# Patient Record
Sex: Female | Born: 1960 | Race: Black or African American | Hispanic: No | Smoking: Never smoker
Health system: Southern US, Community
[De-identification: ages and names within clinical notes are randomized; demographics above are authoritative.]

## PROBLEM LIST (undated history)

## (undated) DIAGNOSIS — M1990 Unspecified osteoarthritis, unspecified site: Secondary | ICD-10-CM

## (undated) DIAGNOSIS — M1611 Unilateral primary osteoarthritis, right hip: Secondary | ICD-10-CM

## (undated) HISTORY — PX: CHOLECYSTECTOMY: SHX55

## (undated) HISTORY — PX: RECTOCELE REPAIR: SHX761

## (undated) HISTORY — PX: TUBAL LIGATION: SHX77

## (undated) HISTORY — PX: ABDOMINAL HYSTERECTOMY: SHX81

---

## 1999-03-01 ENCOUNTER — Encounter: Payer: Self-pay | Admitting: Emergency Medicine

## 1999-03-01 ENCOUNTER — Emergency Department (HOSPITAL_COMMUNITY): Admission: EM | Admit: 1999-03-01 | Discharge: 1999-03-01 | Payer: Self-pay | Admitting: *Deleted

## 2002-10-07 ENCOUNTER — Inpatient Hospital Stay (HOSPITAL_COMMUNITY): Admission: RE | Admit: 2002-10-07 | Discharge: 2002-10-09 | Payer: Self-pay | Admitting: Obstetrics and Gynecology

## 2004-04-27 ENCOUNTER — Ambulatory Visit (HOSPITAL_COMMUNITY): Admission: RE | Admit: 2004-04-27 | Discharge: 2004-04-27 | Payer: Self-pay | Admitting: Obstetrics and Gynecology

## 2005-05-20 ENCOUNTER — Ambulatory Visit (HOSPITAL_COMMUNITY): Admission: RE | Admit: 2005-05-20 | Discharge: 2005-05-20 | Payer: Self-pay | Admitting: Obstetrics and Gynecology

## 2005-08-01 ENCOUNTER — Observation Stay (HOSPITAL_COMMUNITY): Admission: RE | Admit: 2005-08-01 | Discharge: 2005-08-02 | Payer: Self-pay | Admitting: Obstetrics and Gynecology

## 2005-08-01 ENCOUNTER — Encounter: Payer: Self-pay | Admitting: Obstetrics and Gynecology

## 2008-02-25 ENCOUNTER — Ambulatory Visit (HOSPITAL_COMMUNITY): Admission: RE | Admit: 2008-02-25 | Discharge: 2008-02-25 | Payer: Self-pay | Admitting: Obstetrics and Gynecology

## 2009-05-12 ENCOUNTER — Ambulatory Visit (HOSPITAL_COMMUNITY): Admission: RE | Admit: 2009-05-12 | Discharge: 2009-05-12 | Payer: Self-pay | Admitting: Obstetrics and Gynecology

## 2009-07-25 ENCOUNTER — Ambulatory Visit (HOSPITAL_COMMUNITY): Admission: RE | Admit: 2009-07-25 | Discharge: 2009-07-25 | Payer: Self-pay | Admitting: Internal Medicine

## 2009-08-08 ENCOUNTER — Ambulatory Visit (HOSPITAL_COMMUNITY): Admission: RE | Admit: 2009-08-08 | Discharge: 2009-08-08 | Payer: Self-pay | Admitting: Internal Medicine

## 2009-08-30 ENCOUNTER — Encounter (INDEPENDENT_AMBULATORY_CARE_PROVIDER_SITE_OTHER): Payer: Self-pay | Admitting: General Surgery

## 2009-08-30 ENCOUNTER — Ambulatory Visit (HOSPITAL_COMMUNITY): Admission: RE | Admit: 2009-08-30 | Discharge: 2009-08-31 | Payer: Self-pay | Admitting: General Surgery

## 2010-08-21 ENCOUNTER — Other Ambulatory Visit: Payer: Self-pay | Admitting: Obstetrics and Gynecology

## 2010-08-21 DIAGNOSIS — Z139 Encounter for screening, unspecified: Secondary | ICD-10-CM

## 2010-08-27 ENCOUNTER — Ambulatory Visit (HOSPITAL_COMMUNITY)
Admission: RE | Admit: 2010-08-27 | Discharge: 2010-08-27 | Disposition: A | Discharge status: Home / Self Care | Payer: BC Managed Care – PPO | Source: Ambulatory Visit | Attending: Obstetrics and Gynecology | Admitting: Obstetrics and Gynecology

## 2010-08-27 DIAGNOSIS — Z139 Encounter for screening, unspecified: Secondary | ICD-10-CM

## 2010-08-27 DIAGNOSIS — Z1231 Encounter for screening mammogram for malignant neoplasm of breast: Secondary | ICD-10-CM | POA: Insufficient documentation

## 2010-09-16 LAB — CBC
HCT: 33.9 % — ABNORMAL LOW (ref 36.0–46.0)
Hemoglobin: 13.8 g/dL (ref 12.0–15.0)
MCHC: 35.2 g/dL (ref 30.0–36.0)
MCV: 100.7 fL — ABNORMAL HIGH (ref 78.0–100.0)
RBC: 3.89 MIL/uL (ref 3.87–5.11)
RDW: 13 % (ref 11.5–15.5)

## 2010-09-16 LAB — DIFFERENTIAL
Basophils Absolute: 0 10*3/uL (ref 0.0–0.1)
Lymphocytes Relative: 20 % (ref 12–46)
Lymphs Abs: 1.3 10*3/uL (ref 0.7–4.0)
Monocytes Absolute: 0.3 10*3/uL (ref 0.1–1.0)
Monocytes Relative: 5 % (ref 3–12)
Monocytes Relative: 6 % (ref 3–12)
Neutro Abs: 4.6 10*3/uL (ref 1.7–7.7)
Neutro Abs: 6.3 10*3/uL (ref 1.7–7.7)
Neutrophils Relative %: 77 % (ref 43–77)

## 2010-09-16 LAB — BASIC METABOLIC PANEL
BUN: 7 mg/dL (ref 6–23)
CO2: 27 mEq/L (ref 19–32)
Calcium: 9.2 mg/dL (ref 8.4–10.5)
GFR calc Af Amer: >60 mL/min (ref >60)
GFR calc non Af Amer: >60 mL/min (ref >60)
GFR calc non Af Amer: >60 mL/min (ref >60)
Glucose, Bld: 91 mg/dL (ref 70–99)
Potassium: 4 mEq/L (ref 3.5–5.1)
Sodium: 136 mEq/L (ref 135–145)

## 2010-09-16 LAB — HEPATIC FUNCTION PANEL
ALT: 15 U/L (ref 0–35)
ALT: 22 U/L (ref 0–35)
AST: 16 U/L (ref 0–37)
AST: 18 U/L (ref 0–37)
Albumin: 3 g/dL — ABNORMAL LOW (ref 3.5–5.2)
Albumin: 3.8 g/dL (ref 3.5–5.2)
Alkaline Phosphatase: 37 U/L — ABNORMAL LOW (ref 39–117)
Bilirubin, Direct: 0.1 mg/dL (ref 0.0–0.3)
Total Bilirubin: 0.6 mg/dL (ref 0.3–1.2)

## 2010-11-09 NOTE — H&P (Signed)
NAMECHINENYE, KATZENBERGER                           ACCOUNT NO.:  [id number]   MEDICAL RECORD NO.:  [id number]                   PATIENT TYPE:  AMB   LOCATION:  DAY                                  FACILITY:  [facility]   PHYSICIAN:  Tilda Burrow, M.D.              DATE OF BIRTH:  1961/05/20   DATE OF ADMISSION:  10/07/2002  DATE OF DISCHARGE:                                HISTORY & PHYSICAL   ADMITTING DIAGNOSES:  1. Uterine fibroids, 10 to 12 weeks' size.  2. Metrorrhagia.  3. Anemia.   HISTORY OF PRESENT ILLNESS:  This 50 year old female, gravida 4, para 4,  known to our practice since 1997, is admitted at this time for abdominal  hysterectomy.  She has been seen for several years with known fibroid  enlargement of the uterus.  Recently, she had heavy periods two to three  times per month, severe anemia with hemoglobin of 7.9 on fingerstick, with  other documentations of hemoglobin 8.5, which is a decline from October  2000, where as hemoglobin was 12.0; this is all attributable to the uterine  bleeding.  Ultrasound has shown that the patient has had fibroid uterine  enlargement and a normal endometrial stripe.  She has been placed on  NuvaRing to control the heavy menses and has been allowed to rebuild her  hemoglobin.  Ultrasound showed uterus length 10.8 cm with fibroids up to 4.0  cm.  Consideration was made to proceed with a standard abdominal  hysterectomy; there was previous consideration of total laparoscopic  hysterectomy, but due to uterine size, decision has been made to convert to  standard abdominal hysterectomy.   PAST MEDICAL HISTORY:  Past medical history benign, negative for  hypertension, diabetes or renal abnormalities.   SURGICAL HISTORY:  Tubal ligation in 1989; Cesarean section.   MEDICATIONS:  Vioxx p.r.n. musculoskeletal discomfort.   ALLERGIES:  None.   MEDICATIONS:  NuvaRing for menstrual control.   PHYSICAL EXAMINATION:  GENERAL:  Physical exam  reveals a healthy-appearing  African American female, height 5 feet 4 inches, weight 190.  VITAL SIGNS:  Blood pressure 125/70.  HEENT:  Pupils equal, round and reactive.  NECK:  Neck supple.  Trachea midline.  CHEST:  Chest clear to auscultation.  BREASTS:  Exam normal without masses.  LUNGS:  Lungs clear.  ABDOMEN:  Abdomen nontender with uterus palpable above the symphysis pubis.  The patient has a well-healed lower abdominal transverse scar from C-  section.  PELVIC:  External genitalia normal.  Vaginal exam shows normal secretions  after treatment for trichomoniasis in March.  Cervix is class I Pap smear,  uterus 12 weeks' size, adnexa negative for masses.   PLANS:  Total abdominal hysterectomy, October 07, 2002, with preservation of  her ovaries planned unless suspicious pathology encountered at the time of  surgery.  Tilda Burrow, M.D.    JVF/MEDQ  D:  10/07/2002  T:  10/07/2002  Job:  [id number]

## 2010-11-09 NOTE — H&P (Signed)
NAMELAGUANA, DESAUTEL               ACCOUNT NO.:  [id number]   MEDICAL RECORD NO.:  [id number]          PATIENT TYPE:  AMB   LOCATION:  DAY                           FACILITY:  [facility]   PHYSICIAN:  Tilda Burrow, M.D. DATE OF BIRTH:  Feb 12, 1961   DATE OF ADMISSION:  08/01/2005  DATE OF DISCHARGE:  LH                                HISTORY & PHYSICAL   ADMISSION DIAGNOSIS:  Rectocele.   SECONDARY DIAGNOSIS:  Abdominal wall keloid status post hysterectomy.   HISTORY OF PRESENT ILLNESS:  This 50 year old female is admitted at this  time for posterior repair with the additional surgery planned for surgical  excision of a large keloid on the anterior abdominal wall greater than 1.5  cm in diameter, protruding out hard, immobile, and flexible. Uncomfortable  to contact and occasionally bleeding over its surface due to contact from  clothing. Old records had shown that she had a smaller keloid prior to her  hysterectomy performed in 2004 but since that time the keloid rather quickly  became prominent, hard, immobile and flexible. There has been no softening  of the scar over time. Additionally the patient complains of rectocele  symptoms, with a sense of incomplete evacuation particularly with hard  stools and the plan is to proceed with posterior repair and excision of  keloid at the same time. The patient is aware that the tendency toward  keloid formation may recur and that postoperative steroid injections are  planned during the recovery phase to reduce the likelihood of this  occurrence. She is aware that the possibility of her keloid recurrence  cannot be completely avoided. I personally never had any posterior repair  keloid formation but the patient knows that I cannot give absolute  guarantee.   PAST MEDICAL HISTORY:  1.  Benign surgical history.  2.  Abdominal hysterectomy, April, 2004.  3.  Tubal ligation, 1989.  4.  Cesarean section, 1980's.   MEDICATIONS:  None.   PHYSICAL EXAMINATION:  GENERAL:  Exam shows a generally healthy African-  American female. Height 5 feet, 4 inches. Weight 201.  VITAL SIGNS:  Blood pressure 116/78.  HEENT:  Pupils are equal, round and reactive. Extraocular movements are  intact.  NECK:  Supple.  CHEST:  Clear to auscultation.  ABDOMEN:  Nontender with well-healed scar fibrosed as described earlier,  measuring approximately 20 cm in length with large keloid.  GU:  External genitalia shows a tight anal sphincter with a small rectocele  which represents a 2 cm x 2 cm very pronounced defect above a tight anal  sphincter which was resulting in incomplete evacuation and difficulty with  defecation.  EXTREMITIES:  Grossly normal.   GC/Chlamydia negative. Pap smear currently normal.   PLAN:  1.  Excision of keloid.  2.  Posterior repair to be performed August 01, 2005.      Tilda Burrow, M.D.  Electronically Signed     JVF/MEDQ  D:  07/31/2005  T:  07/31/2005  Job:  [id number]   cc:   Madelin Rear. Sherwood Gambler, MD  Fax: [phone]

## 2010-11-09 NOTE — Discharge Summary (Signed)
   Hannah Daugherty, Hannah Daugherty                           ACCOUNT NO.:  [id number]   MEDICAL RECORD NO.:  [id number]                   PATIENT TYPE:  INP   LOCATION:  A420                                 FACILITY:  [facility]   PHYSICIAN:  Tilda Burrow, M.D.              DATE OF BIRTH:  1960/06/25   DATE OF ADMISSION:  10/07/2002  DATE OF DISCHARGE:  10/09/2002                                 DISCHARGE SUMMARY   ADMISSION DIAGNOSIS:  1. Menorrhagia.  2. Uterine fibroids, 10- to 12-week size.  3. Anemia.   DISCHARGE DIAGNOSES:  1. Menorrhagia.  2. Uterine fibroids, 8-week size.  3. Anemia.  4. Extensive abdominal adhesions to anterior abdominal wall.  5. Endometriosis, grade 1.  6. Cervical dysplasia, moderate grade.   DISCHARGE MEDICATIONS:  1. Tylox one to two q.4h. p.r.n. pain, dispensed 20.  2. Motrin 200 mg q.4h. p.r.n. mild pain.  3. Multivitamins and iron p.r.n. anemia.   HOSPITAL COURSE:  Patient was admitted on 10/07/2002, undergoing abdominal  hysterectomy, as described in the operative note, with extensive adhesions  dissecting from the anterior abdominal wall to the uterine fundus due to her  prior surgeries.  Patient had 400-mL blood loss.  Postoperatively, patient  had an excellent course, considering the degree of difficulty of the  surgery.  Hemoglobin was 10, hematocrit 30.  Incision and dressing were dry.  Patient was stable for discharge on postop day two, tolerating a regular  diet, doing well.  It was felt that the patient was considered stable for  followup in five days in our office for staple removal.   ADDENDUM:  Pathology report shows moderate-grade dysplasia of the cervix and  also showed multiple, intramural and subendometrial fibroids and a  hemorrhagic corpus luteum cyst on the right ovary.  The endometriosis was  confirmed by biopsy as well.                                               Tilda Burrow, M.D.    JVF/MEDQ  D:  11/01/2002  T:   11/02/2002  Job:  [id number]

## 2010-11-09 NOTE — Op Note (Signed)
Hannah Daugherty, Hannah Daugherty                           ACCOUNT NO.:  [id number]   MEDICAL RECORD NO.:  [id number]                   PATIENT TYPE:  INP   LOCATION:  A420                                 FACILITY:  [facility]   PHYSICIAN:  Tilda Burrow, M.D.              DATE OF BIRTH:  07/01/1960   DATE OF PROCEDURE:  DATE OF DISCHARGE:  10/09/2002                                 OPERATIVE REPORT   PREOPERATIVE DIAGNOSES:  1. Uterine fibroids.  2. Menorrhagia.  3. Anemia.   POSTOPERATIVE DIAGNOSES:  1. Menorrhagia.  2. Fibroids.  3. Anemia.  4. Uterine adhesions.  5. Anterior abdominal wall endometriosis.   PROCEDURE:  Total abdominal hysterectomy, right salpingo-oophorectomy, lysis  of adhesions.   SURGEON:  Tilda Burrow, M.D.   ASSISTANT:  __________.   ANESTHESIA:  General.   COMPLICATIONS:  None.   ESTIMATED BLOOD LOSS:  400 cc.   FINDINGS:  Extensive dense adhesions from anterior abdominal wall to top of  uterus.  Bleeding from right ovary requiring ovarian excision.  Tiny bits of  endometriosis in the site of the prior tubal sterilization.   DESCRIPTION OF PROCEDURE:  The patient was taken to the operating room and  prepped and draped for lower abdominal surgery.  The previous old abdominal  scar was excised of cicatrix.  We ran into technical challenges at the time  we entered the uterine cavity because we were identifying its fibrous  tissue, and it could be palpated that the uterus was stuck to the anterior  abdominal wall.  We were able to find a window in the peritoneum and enter  just lateral to the uterus.  This required extensive, careful, cautious  dissection to dissect the uterine fundus away from the underlying tissue.  We proceeded with dissection of the uterine fundus sufficiently that normal  anatomy could be restored for the most part.  There was some dissection into  the uterine fundus during this part of the procedure, and bleeding was  greater than  usual.  The round ligaments could be taken down bilaterally,  and then the utero-ovarian ligament and fallopian tube pedicle were  crossclamped on either side, transected, and suture ligated.  We then  developed a bladder flap anteriorly with some difficulty due to the  adhesions but were able to then march down the uterus on either side.  The  surgery became easier once the uterus was mobilized.  The uterine vessels  were clamped, cut, and suture ligated on either side using straight Heaney  clamps, 0 chromic, and Mayo scissors dissection.  The upper and lower  cardinal ligaments were taken down with straight Heaney clamps, knife  dissection, and 0 chromic suture ligature.  Upon reaching the vaginal cuff,  we proceeded to stab into the anterior cervical edge of the fornix, and  grasped the cuff with Kocher clamps and excising the cervix.  The cuff  was  closed using figure-of-eight sutures of 0 chromic with Aldridge stitches  placed at each lateral edge __________ to achieve hemostasis and cuff  support.  The patient then had the procedure completed by irrigation of the  pelvis, closure of the peritoneal floor, and spacing the pedicles.  The  right ovary had become somewhat macerated during the procedure, and its  pedicle continued to ooze.  After efforts to stitch, the ovary did not  achieve adequate hemostasis, so salpingo-oophorectomy was necessary.  The  infundibulopelvic ligament was isolated, clamped, cut, and suture ligated.  The ovary was transected.  The pelvis was irrigated again, confirming  hemostasis.  The anterior peritoneum was closed using 2-0 chromic with some  acknowledged area of irregularity in the suspected future adhesions to the  anterior abdominal wall.  The omentum was pulled over the area of  irregularity.   The rectus muscles were pulled in the midline and the fascia closed using 0  Vicryl, and staple closure of the skin completed the procedure.  The patient   tolerated the procedure well.                                               Tilda Burrow, M.D.    JVF/MEDQ  D:  11/01/2002  T:  11/02/2002  Job:  [id number]

## 2010-11-09 NOTE — Op Note (Signed)
Hannah Daugherty, Hannah Daugherty               ACCOUNT NO.:  [id number]   MEDICAL RECORD NO.:  [id number]          PATIENT TYPE:  INP   LOCATION:  A419                          FACILITY:  [facility]   PHYSICIAN:  Tilda Burrow, M.D. DATE OF BIRTH:  03-Aug-1960   DATE OF PROCEDURE:  08/01/2005  DATE OF DISCHARGE:                                 OPERATIVE REPORT   PREOPERATIVE DIAGNOSIS:  Rectocele, abdominal keloid.   POSTOPERATIVE DIAGNOSIS:  Rectocele, abdominal keloid.   PROCEDURE:  1.  Posterior repair.  2.  Excision of abdominal wall keloid, 15 cm length.   SURGEON:  Tilda Burrow, M.D.   ASSISTANTAnnabell Howells, R.N.   ANESTHESIA:  General.   COMPLICATIONS:  None.   FINDINGS:  Generalized laxity of perineal body with extensive forward  rotation of the rectum. Abdominal wall scar well described in HPI.   DETAILS OF PROCEDURE:  The patient was taken to the operating room, prepped  and draped for lower abdominal surgery first. The Pfannenstiel-type incision  scar was present. Three-fourths of it on the left side and extending half  way across the right side was excised using #15 blade on each edge of the  old cicatrix, which was then grasped, elevated, and Bovie cautery used to  core out the cicatrix and the underlying fibrosis. The area was quite  vascular, and point cautery was used as necessary. The skin tone was quite  good, so we had to undermine the tissues of it to give good skin edge  approximation. Any underlying subcutaneous fibrosis was trimmed out to be  sure that the abdomen was not thicker just under the scar. We then pulled  the mobilized subcutaneous fatty tissues together with a series of  interrupted vertical mattress sutures with 2-0 plain. The subcutaneous  tissues were reapproximated using subcuticular 4-0 Dexon during the middle  one half of the incision. The entire incision was then closed using staple  closure of the skin. Hemostasis was quite satisfactory at this  time.   The wound was draped with OpSite, and so then we proceeded with posterior  repair.   The patient was repositioned with legs elevated in candy-cane support  stirrups and prepped and draped. The very lax, redundant vaginal mucosa was  grasped with an Allis clamp two third of the way up the posterior vaginal  wall, as well as grasped in the midline at the introitus. The skin was split  from over the posterior vagina from the introitus at the level of the hymen  remnant, for a distance half way up the vagina. The underlying tissues were  undermined on either side. Tissues were incredibly relaxed and hypermobile.  A double-gloved digital rectal exam was performed with the operator's right  index finger to allow identification of tissues. That way, we stayed out of  the rectum. We then proceeded to dissect laterally above the tissues of the  rectum and on either side to identify more well connected supportive tissue.  Allis clamps were used to grasp the tissues. A series of half dozen  interrupted vertical mattress sutures were placed  beginning well off the  posterior vagina, building a more reinforced posterior vaginal wall.  Interrupted sutures were pulled together and tagged, and after the index  finger was removed, then these were tied to the midline after appropriate  changing of gloves, etc.   The digital exam was repeated with a double-gloved hand and double-gloved  finger, and two additional sutures were found to be necessary at the lower  most portions just above the anal sphincter. This resulted in a good  perineal body support two thirds way up the vagina. This allowed easy  introduction of two fingers transversely but dramatically reduced anterior  mobility of the rectum. The patient tolerated the procedure well. Redundant  vaginal mucosa was trimmed, the edges pulled in the midline for  reapproximation. Sponge and needle counts were correct. The patient went to  the  recovery room in good condition, having received surgery with 150 cc  blood loss. Note, antibiotic prophylaxis administered preprocedure.      Tilda Burrow, M.D.  Electronically Signed     JVF/MEDQ  D:  08/01/2005  T:  08/02/2005  Job:  [id number]   cc:   [facility]   [facility]   Madelin Rear. Sherwood Gambler, MD  Fax: [phone]

## 2014-01-24 ENCOUNTER — Ambulatory Visit (INDEPENDENT_AMBULATORY_CARE_PROVIDER_SITE_OTHER): Payer: 59 | Admitting: Obstetrics & Gynecology

## 2014-01-24 ENCOUNTER — Encounter: Payer: Self-pay | Admitting: Obstetrics & Gynecology

## 2014-01-24 VITALS — BP 152/100 | Ht 64.0 in | Wt 198.0 lb

## 2014-01-24 DIAGNOSIS — N760 Acute vaginitis: Secondary | ICD-10-CM | POA: Insufficient documentation

## 2014-01-24 MED ORDER — METRONIDAZOLE 0.75 % VA GEL: VAGINAL | Status: DC

## 2014-01-24 NOTE — Progress Notes (Signed)
Patient ID: Ezra Siteseressa F Pitcher, female   DOB: 04/27/1961, 53 y.o.   MRN: [id number] Chief Complaint  Patient presents with  . Vaginal Bleeding    Had hysterectomy    HPI 2 episodes of pink/reddish spotting Was there when she wiped No sure of source  ROS No burning with urination, frequency or urgency No nausea, vomiting or diarrhea Nor fever chills or other constitutional symptoms   Blood pressure 152/100, height 5\' 4"  (1.626 m), weight 198 lb (89.812 kg).  EXAM Abdomen:       Vulva:            normal appearing vulva with no masses, tenderness or lesions Vagina:          normal mucosa, thin grey discharge Cervix:           absent Uterus:          absent Adnexa:         normal adnexa in size, nontender and no masses Rectal:           negative without mass, lesions or tenderness, stool guaiac negative Hemoccult:     Performed  Wet prep:  +BV No yeast no trich                          Assessment/Plan:  BV: metrogel qhs x 5 Probably undergoing some vaginal changes

## 2015-09-23 HISTORY — PX: ANKLE SURGERY: SHX546

## 2016-09-30 DIAGNOSIS — E559 Vitamin D deficiency, unspecified: Secondary | ICD-10-CM | POA: Diagnosis not present

## 2016-09-30 DIAGNOSIS — R7301 Impaired fasting glucose: Secondary | ICD-10-CM | POA: Diagnosis not present

## 2016-09-30 DIAGNOSIS — I10 Essential (primary) hypertension: Secondary | ICD-10-CM | POA: Diagnosis not present

## 2016-10-02 DIAGNOSIS — R7301 Impaired fasting glucose: Secondary | ICD-10-CM | POA: Diagnosis not present

## 2016-10-02 DIAGNOSIS — Z0000 Encounter for general adult medical examination without abnormal findings: Secondary | ICD-10-CM | POA: Diagnosis not present

## 2016-10-02 DIAGNOSIS — I10 Essential (primary) hypertension: Secondary | ICD-10-CM | POA: Diagnosis not present

## 2016-10-04 ENCOUNTER — Other Ambulatory Visit (HOSPITAL_COMMUNITY): Payer: Self-pay | Admitting: Internal Medicine

## 2016-10-04 DIAGNOSIS — Z1231 Encounter for screening mammogram for malignant neoplasm of breast: Secondary | ICD-10-CM

## 2016-10-08 DIAGNOSIS — Z1231 Encounter for screening mammogram for malignant neoplasm of breast: Secondary | ICD-10-CM | POA: Diagnosis not present

## 2016-10-16 DIAGNOSIS — M1611 Unilateral primary osteoarthritis, right hip: Secondary | ICD-10-CM | POA: Diagnosis not present

## 2016-10-18 ENCOUNTER — Ambulatory Visit (HOSPITAL_COMMUNITY): Payer: Self-pay

## 2016-10-25 ENCOUNTER — Other Ambulatory Visit: Payer: Self-pay | Admitting: Orthopedic Surgery

## 2016-11-13 ENCOUNTER — Encounter (HOSPITAL_COMMUNITY): Payer: Self-pay

## 2016-11-13 NOTE — Pre-Procedure Instructions (Addendum)
Hannah Daugherty  11/13/2016      [facility] [address] - [place], [place] - [address] [place] [address] Phone: [phone] Fax: [phone]    Your procedure is scheduled on Tues. June 5  [facility] at 11:15 A.M.  Call this number if you have problems the morning of surgery:  [phone]   Remember:  Do not eat food or drink liquids after midnight on Mon. June 4   Take these medicines the morning of surgery with A SIP OF WATER : None             1 week prior to surgery stop: advil, motrin, ibuprofen, aleve, BC Powders, Goody's, herbal life pill    Do not wear jewelry, make-up or nail polish.  Do not wear lotions, powders, or perfumes, or deoderant.  Do not shave 48 hours prior to surgery.  Men may shave face and neck.  Do not bring valuables to the hospital.  [facility] is not responsible for any belongings or valuables.  Contacts, dentures or bridgework may not be worn into surgery.  Leave your suitcase in the car.  After surgery it may be brought to your room.  For patients admitted to the hospital, discharge time will be determined by your treatment team.  Patients discharged the day of surgery will not be allowed to drive home.    Special instructions:  [facility]- Preparing For Surgery  Before surgery, you can play an important role. Because skin is not sterile, your skin needs to be as free of germs as possible. You can reduce the number of germs on your skin by washing with CHG (chlorahexidine gluconate) Soap before surgery.  CHG is an antiseptic cleaner which kills germs and bonds with the skin to continue killing germs even after washing.  Please do not use if you have an allergy to CHG or antibacterial soaps. If your skin becomes reddened/irritated stop using the CHG.  Do not shave (including legs and underarms) for at least 48 hours prior to first CHG shower. It is OK to shave your face.  Please follow these  instructions carefully.   1. Shower the NIGHT BEFORE SURGERY and the MORNING OF SURGERY with CHG.   2. If you chose to wash your hair, wash your hair first as usual with your normal shampoo.  3. After you shampoo, rinse your hair and body thoroughly to remove the shampoo.  4. Use CHG as you would any other liquid soap. You can apply CHG directly to the skin and wash gently with a scrungie or a clean washcloth.   5. Apply the CHG Soap to your body ONLY FROM THE NECK DOWN.  Do not use on open wounds or open sores. Avoid contact with your eyes, ears, mouth and genitals (private parts). Wash genitals (private parts) with your normal soap.  6. Wash thoroughly, paying special attention to the area where your surgery will be performed.  7. Thoroughly rinse your body with warm water from the neck down.  8. DO NOT shower/wash with your normal soap after using and rinsing off the CHG Soap.  9. Pat yourself dry with a CLEAN TOWEL.   10. Wear CLEAN PAJAMAS   11. Place CLEAN SHEETS on your bed the night of your first shower and DO NOT SLEEP WITH PETS.    Day of Surgery: Do not apply any deodorants/lotions. Please wear clean clothes to the hospital/surgery  center.      Please read over the following fact sheets that you were given. Coughing and Deep Breathing, MRSA Information and Surgical Site Infection Prevention

## 2016-11-14 ENCOUNTER — Encounter (HOSPITAL_COMMUNITY): Payer: Self-pay

## 2016-11-14 ENCOUNTER — Encounter (HOSPITAL_COMMUNITY)
Admission: RE | Admit: 2016-11-14 | Discharge: 2016-11-14 | Disposition: A | Discharge status: Home / Self Care | Payer: 59 | Source: Ambulatory Visit | Attending: Orthopedic Surgery | Admitting: Orthopedic Surgery

## 2016-11-14 DIAGNOSIS — M1611 Unilateral primary osteoarthritis, right hip: Secondary | ICD-10-CM | POA: Diagnosis not present

## 2016-11-14 DIAGNOSIS — Z01812 Encounter for preprocedural laboratory examination: Secondary | ICD-10-CM | POA: Insufficient documentation

## 2016-11-14 DIAGNOSIS — Z01810 Encounter for preprocedural cardiovascular examination: Secondary | ICD-10-CM | POA: Insufficient documentation

## 2016-11-14 HISTORY — DX: Unspecified osteoarthritis, unspecified site: M19.90

## 2016-11-14 LAB — BASIC METABOLIC PANEL
Anion gap: 7 (ref 5–15)
BUN: 19 mg/dL (ref 6–20)
CALCIUM: 9.2 mg/dL (ref 8.9–10.3)
CO2: 28 mmol/L (ref 22–32)
Chloride: 106 mmol/L (ref 101–111)
Creatinine, Ser: 0.87 mg/dL (ref 0.44–1.00)
GFR calc Af Amer: >60 mL/min (ref >60)
GLUCOSE: 93 mg/dL (ref 65–99)
POTASSIUM: 4.1 mmol/L (ref 3.5–5.1)
Sodium: 141 mmol/L (ref 135–145)

## 2016-11-14 LAB — CBC
HEMATOCRIT: 40.1 % (ref 36.0–46.0)
Hemoglobin: 13.4 g/dL (ref 12.0–15.0)
MCH: 32.4 pg (ref 26.0–34.0)
MCHC: 33.4 g/dL (ref 30.0–36.0)
MCV: 97.1 fL (ref 78.0–100.0)
PLATELETS: 211 10*3/uL (ref 150–400)
RBC: 4.13 MIL/uL (ref 3.87–5.11)
RDW: 12.6 % (ref 11.5–15.5)
WBC: 5.6 10*3/uL (ref 4.0–10.5)

## 2016-11-14 LAB — SURGICAL PCR SCREEN
MRSA, PCR: NEGATIVE
STAPHYLOCOCCUS AUREUS: NEGATIVE

## 2016-11-14 NOTE — Progress Notes (Signed)
PCP: Dr. Fidel LevyJohn Zack Hall in [place]

## 2016-11-25 ENCOUNTER — Encounter (HOSPITAL_COMMUNITY): Payer: Self-pay | Admitting: Anesthesiology

## 2016-11-25 MED ORDER — CEFAZOLIN SODIUM-DEXTROSE 2-4 GM/100ML-% IV SOLN
2.0000 g | INTRAVENOUS | Status: AC
  Administered 2016-11-26: 2 g via INTRAVENOUS
  Filled 2016-11-25: qty 100

## 2016-11-25 NOTE — Anesthesia Preprocedure Evaluation (Addendum)
Anesthesia Evaluation  Patient identified by MRN, date of birth, ID band Patient awake    Reviewed: Allergy & Precautions, NPO status , Patient's Chart, lab work & pertinent test results  Airway Mallampati: II  TM Distance: >3 FB Neck ROM: Full    Dental no notable dental hx. (+) Teeth Intact   Pulmonary neg pulmonary ROS,    Pulmonary exam normal breath sounds clear to auscultation       Cardiovascular negative cardio ROS Normal cardiovascular exam Rhythm:Regular Rate:Normal     Neuro/Psych negative neurological ROS  negative psych ROS   GI/Hepatic negative GI ROS, Neg liver ROS,   Endo/Other  Obesity   Renal/GU negative Renal ROS  negative genitourinary   Musculoskeletal  (+) Arthritis , Osteoarthritis,  DJD right hip    Abdominal (+) + obese,   Peds  Hematology negative hematology ROS (+)   Anesthesia Other Findings   Reproductive/Obstetrics                            Anesthesia Physical Anesthesia Plan  ASA: II  Anesthesia Plan: General   Post-op Pain Management:    Induction:   PONV Risk Score and Plan: 3 and Ondansetron, Dexamethasone, Propofol, Midazolam, Treatment may vary due to age and Scopolamine patch - Pre-op  Airway Management Planned: Oral ETT  Additional Equipment:   Intra-op Plan:   Post-operative Plan: Extubation in OR  Informed Consent: I have reviewed the patients History and Physical, chart, labs and discussed the procedure including the risks, benefits and alternatives for the proposed anesthesia with the patient or authorized representative who has indicated his/her understanding and acceptance.   Dental advisory given  Plan Discussed with: Anesthesiologist, CRNA and Surgeon  Anesthesia Plan Comments:        Anesthesia Quick Evaluation

## 2016-11-26 ENCOUNTER — Encounter (HOSPITAL_COMMUNITY): Payer: Self-pay | Admitting: General Practice

## 2016-11-26 ENCOUNTER — Inpatient Hospital Stay (HOSPITAL_COMMUNITY)
Admission: RE | Admit: 2016-11-26 | Discharge: 2016-11-28 | DRG: 470 | Disposition: A | Discharge status: Home / Self Care | Payer: 59 | Source: Ambulatory Visit | Attending: Orthopedic Surgery | Admitting: Orthopedic Surgery

## 2016-11-26 ENCOUNTER — Inpatient Hospital Stay (HOSPITAL_COMMUNITY): Payer: 59

## 2016-11-26 ENCOUNTER — Inpatient Hospital Stay (HOSPITAL_COMMUNITY): Payer: 59 | Admitting: Anesthesiology

## 2016-11-26 ENCOUNTER — Encounter (HOSPITAL_COMMUNITY)
Admission: RE | Disposition: A | Discharge status: Home / Self Care | Payer: Self-pay | Source: Ambulatory Visit | Attending: Orthopedic Surgery

## 2016-11-26 DIAGNOSIS — M1610 Unilateral primary osteoarthritis, unspecified hip: Secondary | ICD-10-CM

## 2016-11-26 DIAGNOSIS — M1611 Unilateral primary osteoarthritis, right hip: Secondary | ICD-10-CM | POA: Diagnosis not present

## 2016-11-26 DIAGNOSIS — Z885 Allergy status to narcotic agent status: Secondary | ICD-10-CM | POA: Diagnosis not present

## 2016-11-26 DIAGNOSIS — Z833 Family history of diabetes mellitus: Secondary | ICD-10-CM | POA: Diagnosis not present

## 2016-11-26 DIAGNOSIS — Z8249 Family history of ischemic heart disease and other diseases of the circulatory system: Secondary | ICD-10-CM

## 2016-11-26 DIAGNOSIS — Z9049 Acquired absence of other specified parts of digestive tract: Secondary | ICD-10-CM | POA: Diagnosis not present

## 2016-11-26 DIAGNOSIS — M25551 Pain in right hip: Secondary | ICD-10-CM | POA: Diagnosis not present

## 2016-11-26 DIAGNOSIS — Z90710 Acquired absence of both cervix and uterus: Secondary | ICD-10-CM | POA: Diagnosis not present

## 2016-11-26 DIAGNOSIS — Z471 Aftercare following joint replacement surgery: Secondary | ICD-10-CM | POA: Diagnosis not present

## 2016-11-26 DIAGNOSIS — Z96641 Presence of right artificial hip joint: Secondary | ICD-10-CM

## 2016-11-26 DIAGNOSIS — Z8049 Family history of malignant neoplasm of other genital organs: Secondary | ICD-10-CM | POA: Diagnosis not present

## 2016-11-26 HISTORY — DX: Unilateral primary osteoarthritis, right hip: M16.11

## 2016-11-26 HISTORY — PX: TOTAL HIP ARTHROPLASTY: SHX124

## 2016-11-26 LAB — GLUCOSE, CAPILLARY: Glucose-Capillary: 118 mg/dL — ABNORMAL HIGH (ref 65–99)

## 2016-11-26 SURGERY — ARTHROPLASTY, HIP, TOTAL,POSTERIOR APPROACH
Anesthesia: General | Site: Hip | Laterality: Right

## 2016-11-26 MED ORDER — PROPOFOL 1000 MG/100ML IV EMUL
INTRAVENOUS | Status: AC
  Filled 2016-11-26: qty 200

## 2016-11-26 MED ORDER — DOCUSATE SODIUM 100 MG PO CAPS
100.0000 mg | ORAL_CAPSULE | Freq: Two times a day (BID) | ORAL | Status: DC
  Administered 2016-11-26 – 2016-11-28 (×4): 100 mg via ORAL
  Filled 2016-11-26 (×4): qty 1

## 2016-11-26 MED ORDER — ROCURONIUM BROMIDE 10 MG/ML (PF) SYRINGE
PREFILLED_SYRINGE | INTRAVENOUS | Status: AC
  Filled 2016-11-26: qty 5

## 2016-11-26 MED ORDER — SENNA 8.6 MG PO TABS
1.0000 | ORAL_TABLET | Freq: Two times a day (BID) | ORAL | Status: DC
  Administered 2016-11-26 – 2016-11-28 (×4): 8.6 mg via ORAL
  Filled 2016-11-26 (×4): qty 1

## 2016-11-26 MED ORDER — LIDOCAINE HCL (CARDIAC) 20 MG/ML IV SOLN
INTRAVENOUS | Status: DC | PRN
  Administered 2016-11-26: 60 mg via INTRAVENOUS

## 2016-11-26 MED ORDER — SENNA-DOCUSATE SODIUM 8.6-50 MG PO TABS: 2.0000 | ORAL_TABLET | Freq: Every day | ORAL | 1 refills | Status: AC

## 2016-11-26 MED ORDER — PROPOFOL 10 MG/ML IV BOLUS
INTRAVENOUS | Status: DC | PRN
  Administered 2016-11-26: 150 mg via INTRAVENOUS

## 2016-11-26 MED ORDER — FENTANYL CITRATE (PF) 100 MCG/2ML IJ SOLN
INTRAMUSCULAR | Status: DC | PRN
  Administered 2016-11-26: 50 ug via INTRAVENOUS
  Administered 2016-11-26 (×2): 100 ug via INTRAVENOUS
  Administered 2016-11-26: 50 ug via INTRAVENOUS
  Administered 2016-11-26: 150 ug via INTRAVENOUS
  Administered 2016-11-26: 50 ug via INTRAVENOUS

## 2016-11-26 MED ORDER — BACLOFEN 10 MG PO TABS: 10.0000 mg | ORAL_TABLET | Freq: Three times a day (TID) | ORAL | 0 refills | Status: AC

## 2016-11-26 MED ORDER — METOCLOPRAMIDE HCL 5 MG PO TABS: 5.0000 mg | ORAL_TABLET | Freq: Three times a day (TID) | ORAL | Status: DC | PRN

## 2016-11-26 MED ORDER — MIDAZOLAM HCL 2 MG/2ML IJ SOLN
INTRAMUSCULAR | Status: AC
  Filled 2016-11-26: qty 2

## 2016-11-26 MED ORDER — 0.9 % SODIUM CHLORIDE (POUR BTL) OPTIME
TOPICAL | Status: DC | PRN
  Administered 2016-11-26: 1000 mL

## 2016-11-26 MED ORDER — DIPHENHYDRAMINE HCL 12.5 MG/5ML PO ELIX: 12.5000 mg | ORAL_SOLUTION | ORAL | Status: DC | PRN

## 2016-11-26 MED ORDER — FENTANYL CITRATE (PF) 250 MCG/5ML IJ SOLN
INTRAMUSCULAR | Status: AC
  Filled 2016-11-26: qty 5

## 2016-11-26 MED ORDER — MAGNESIUM CITRATE PO SOLN: 1.0000 | Freq: Once | ORAL | Status: DC | PRN

## 2016-11-26 MED ORDER — CEFAZOLIN SODIUM-DEXTROSE 2-4 GM/100ML-% IV SOLN
2.0000 g | Freq: Four times a day (QID) | INTRAVENOUS | Status: AC
  Administered 2016-11-26 (×2): 2 g via INTRAVENOUS
  Filled 2016-11-26 (×2): qty 100

## 2016-11-26 MED ORDER — RIVAROXABAN 10 MG PO TABS: 10.0000 mg | ORAL_TABLET | Freq: Every day | ORAL | 0 refills | Status: AC

## 2016-11-26 MED ORDER — BUPIVACAINE HCL (PF) 0.25 % IJ SOLN
INTRAMUSCULAR | Status: DC | PRN
  Administered 2016-11-26: 30 mL

## 2016-11-26 MED ORDER — HYDROMORPHONE HCL 1 MG/ML IJ SOLN
0.2500 mg | INTRAMUSCULAR | Status: DC | PRN
  Administered 2016-11-26 (×2): 0.5 mg via INTRAVENOUS

## 2016-11-26 MED ORDER — ACETAMINOPHEN 325 MG PO TABS
650.0000 mg | ORAL_TABLET | Freq: Four times a day (QID) | ORAL | Status: DC | PRN
  Administered 2016-11-27: 650 mg via ORAL
  Filled 2016-11-26: qty 2

## 2016-11-26 MED ORDER — METHOCARBAMOL 500 MG PO TABS
ORAL_TABLET | ORAL | Status: AC
  Filled 2016-11-26: qty 1

## 2016-11-26 MED ORDER — HYDROMORPHONE HCL 1 MG/ML IJ SOLN: 0.5000 mg | INTRAMUSCULAR | Status: DC | PRN

## 2016-11-26 MED ORDER — LACTATED RINGERS IV SOLN
INTRAVENOUS | Status: DC | PRN
  Administered 2016-11-26 (×2): via INTRAVENOUS

## 2016-11-26 MED ORDER — METHOCARBAMOL 500 MG PO TABS
500.0000 mg | ORAL_TABLET | Freq: Four times a day (QID) | ORAL | Status: DC | PRN
  Administered 2016-11-26 (×2): 500 mg via ORAL
  Filled 2016-11-26: qty 1

## 2016-11-26 MED ORDER — HYDROMORPHONE HCL 1 MG/ML IJ SOLN
INTRAMUSCULAR | Status: AC
  Filled 2016-11-26: qty 1

## 2016-11-26 MED ORDER — ZOLPIDEM TARTRATE 5 MG PO TABS: 5.0000 mg | ORAL_TABLET | Freq: Every evening | ORAL | Status: DC | PRN

## 2016-11-26 MED ORDER — DEXAMETHASONE SODIUM PHOSPHATE 10 MG/ML IJ SOLN
10.0000 mg | Freq: Once | INTRAMUSCULAR | Status: AC
  Administered 2016-11-27: 10 mg via INTRAVENOUS
  Filled 2016-11-26: qty 1

## 2016-11-26 MED ORDER — ONDANSETRON HCL 4 MG/2ML IJ SOLN
INTRAMUSCULAR | Status: DC | PRN
  Administered 2016-11-26: 4 mg via INTRAVENOUS

## 2016-11-26 MED ORDER — ROCURONIUM BROMIDE 100 MG/10ML IV SOLN
INTRAVENOUS | Status: DC | PRN
  Administered 2016-11-26: 50 mg via INTRAVENOUS
  Administered 2016-11-26: 10 mg via INTRAVENOUS

## 2016-11-26 MED ORDER — PROMETHAZINE HCL 25 MG/ML IJ SOLN: 6.2500 mg | INTRAMUSCULAR | Status: DC | PRN

## 2016-11-26 MED ORDER — KETOROLAC TROMETHAMINE 15 MG/ML IJ SOLN
INTRAMUSCULAR | Status: AC
  Filled 2016-11-26: qty 1

## 2016-11-26 MED ORDER — OXYCODONE HCL 5 MG PO TABS
5.0000 mg | ORAL_TABLET | ORAL | Status: DC | PRN
  Administered 2016-11-26 – 2016-11-27 (×3): 10 mg via ORAL
  Administered 2016-11-28: 5 mg via ORAL
  Filled 2016-11-26 (×4): qty 2
  Filled 2016-11-26: qty 1

## 2016-11-26 MED ORDER — SUCCINYLCHOLINE CHLORIDE 20 MG/ML IJ SOLN
INTRAMUSCULAR | Status: DC | PRN
  Administered 2016-11-26: 120 mg via INTRAVENOUS

## 2016-11-26 MED ORDER — SCOPOLAMINE 1 MG/3DAYS TD PT72
MEDICATED_PATCH | TRANSDERMAL | Status: DC | PRN
  Administered 2016-11-26: 1 via TRANSDERMAL

## 2016-11-26 MED ORDER — POTASSIUM CHLORIDE IN NACL 20-0.45 MEQ/L-% IV SOLN
INTRAVENOUS | Status: DC
  Administered 2016-11-26: 12:00:00 via INTRAVENOUS
  Filled 2016-11-26 (×2): qty 1000

## 2016-11-26 MED ORDER — FENTANYL CITRATE (PF) 250 MCG/5ML IJ SOLN
INTRAMUSCULAR | Status: AC
Start: 2016-11-26 — End: 2016-11-26
  Filled 2016-11-26: qty 5

## 2016-11-26 MED ORDER — BISACODYL 10 MG RE SUPP: 10.0000 mg | Freq: Every day | RECTAL | Status: DC | PRN

## 2016-11-26 MED ORDER — MEPERIDINE HCL 25 MG/ML IJ SOLN: 6.2500 mg | INTRAMUSCULAR | Status: DC | PRN

## 2016-11-26 MED ORDER — ONDANSETRON HCL 4 MG PO TABS: 4.0000 mg | ORAL_TABLET | Freq: Four times a day (QID) | ORAL | Status: DC | PRN

## 2016-11-26 MED ORDER — MIDAZOLAM HCL 5 MG/5ML IJ SOLN
INTRAMUSCULAR | Status: DC | PRN
  Administered 2016-11-26: 2 mg via INTRAVENOUS

## 2016-11-26 MED ORDER — ALUM & MAG HYDROXIDE-SIMETH 200-200-20 MG/5ML PO SUSP: 30.0000 mL | ORAL | Status: DC | PRN

## 2016-11-26 MED ORDER — PROPOFOL 10 MG/ML IV BOLUS
INTRAVENOUS | Status: AC
  Filled 2016-11-26: qty 20

## 2016-11-26 MED ORDER — ACETAMINOPHEN 650 MG RE SUPP: 650.0000 mg | Freq: Four times a day (QID) | RECTAL | Status: DC | PRN

## 2016-11-26 MED ORDER — BUPIVACAINE HCL (PF) 0.25 % IJ SOLN
INTRAMUSCULAR | Status: AC
  Filled 2016-11-26: qty 30

## 2016-11-26 MED ORDER — SCOPOLAMINE 1 MG/3DAYS TD PT72
MEDICATED_PATCH | TRANSDERMAL | Status: AC
  Filled 2016-11-26: qty 1

## 2016-11-26 MED ORDER — ONDANSETRON HCL 4 MG/2ML IJ SOLN
4.0000 mg | Freq: Four times a day (QID) | INTRAMUSCULAR | Status: DC | PRN
  Administered 2016-11-26: 4 mg via INTRAVENOUS
  Filled 2016-11-26: qty 2

## 2016-11-26 MED ORDER — METOCLOPRAMIDE HCL 5 MG/ML IJ SOLN
5.0000 mg | Freq: Three times a day (TID) | INTRAMUSCULAR | Status: DC | PRN
  Administered 2016-11-26: 10 mg via INTRAVENOUS
  Filled 2016-11-26: qty 2

## 2016-11-26 MED ORDER — EPHEDRINE SULFATE 50 MG/ML IJ SOLN
INTRAMUSCULAR | Status: DC | PRN
  Administered 2016-11-26 (×3): 10 mg via INTRAVENOUS

## 2016-11-26 MED ORDER — ONDANSETRON HCL 4 MG PO TABS: 4.0000 mg | ORAL_TABLET | Freq: Three times a day (TID) | ORAL | 0 refills | Status: AC | PRN

## 2016-11-26 MED ORDER — PHENOL 1.4 % MT LIQD: 1.0000 | OROMUCOSAL | Status: DC | PRN

## 2016-11-26 MED ORDER — SUGAMMADEX SODIUM 200 MG/2ML IV SOLN
INTRAVENOUS | Status: DC | PRN
  Administered 2016-11-26: 200 mg via INTRAVENOUS

## 2016-11-26 MED ORDER — KETOROLAC TROMETHAMINE 15 MG/ML IJ SOLN
15.0000 mg | Freq: Four times a day (QID) | INTRAMUSCULAR | Status: AC
  Administered 2016-11-26 – 2016-11-27 (×4): 15 mg via INTRAVENOUS
  Filled 2016-11-26 (×3): qty 1

## 2016-11-26 MED ORDER — DEXTROSE 5 % IV SOLN
500.0000 mg | Freq: Four times a day (QID) | INTRAVENOUS | Status: DC | PRN
  Filled 2016-11-26: qty 5

## 2016-11-26 MED ORDER — GABAPENTIN 300 MG PO CAPS
300.0000 mg | ORAL_CAPSULE | Freq: Three times a day (TID) | ORAL | Status: DC
  Administered 2016-11-26 – 2016-11-28 (×6): 300 mg via ORAL
  Filled 2016-11-26 (×6): qty 1

## 2016-11-26 MED ORDER — RIVAROXABAN 10 MG PO TABS
10.0000 mg | ORAL_TABLET | Freq: Every day | ORAL | Status: DC
  Administered 2016-11-27 – 2016-11-28 (×2): 10 mg via ORAL
  Filled 2016-11-26 (×2): qty 1

## 2016-11-26 MED ORDER — SODIUM CHLORIDE 0.9 % IR SOLN: Status: DC | PRN

## 2016-11-26 MED ORDER — OXYCODONE HCL 5 MG PO TABS
5.0000 mg | ORAL_TABLET | ORAL | 0 refills | Status: AC | PRN
Start: 2016-11-26 — End: ?

## 2016-11-26 MED ORDER — MENTHOL 3 MG MT LOZG: 1.0000 | LOZENGE | OROMUCOSAL | Status: DC | PRN

## 2016-11-26 MED ORDER — POLYETHYLENE GLYCOL 3350 17 G PO PACK: 17.0000 g | PACK | Freq: Every day | ORAL | Status: DC | PRN

## 2016-11-26 MED ORDER — EPHEDRINE 5 MG/ML INJ
INTRAVENOUS | Status: AC
  Filled 2016-11-26: qty 10

## 2016-11-26 SURGICAL SUPPLY — 50 items
BIT DRILL 5/64X5 DISP (BIT) ×2 IMPLANT
BLADE SAW SAG 73X25 THK (BLADE) ×1
BLADE SAW SGTL 73X25 THK (BLADE) ×1 IMPLANT
BLADE SURG 10 STRL SS (BLADE) ×2 IMPLANT
CAPT HIP TOTAL 2 ×2 IMPLANT
CLSR STERI-STRIP ANTIMIC 1/2X4 (GAUZE/BANDAGES/DRESSINGS) ×4 IMPLANT
COVER SURGICAL LIGHT HANDLE (MISCELLANEOUS) ×2 IMPLANT
DRAPE INCISE IOBAN 66X45 STRL (DRAPES) ×2 IMPLANT
DRAPE ORTHO SPLIT 77X108 STRL (DRAPES) ×2
DRAPE SURG ORHT 6 SPLT 77X108 (DRAPES) ×2 IMPLANT
DRAPE U-SHAPE 47X51 STRL (DRAPES) ×2 IMPLANT
DRSG MEPILEX BORDER 4X12 (GAUZE/BANDAGES/DRESSINGS) ×2 IMPLANT
DRSG MEPILEX BORDER 4X8 (GAUZE/BANDAGES/DRESSINGS) IMPLANT
DURAPREP 26ML APPLICATOR (WOUND CARE) ×6 IMPLANT
ELECT CAUTERY BLADE 6.4 (BLADE) ×2 IMPLANT
ELECT REM PT RETURN 9FT ADLT (ELECTROSURGICAL) ×2
ELECTRODE REM PT RTRN 9FT ADLT (ELECTROSURGICAL) ×1 IMPLANT
GLOVE BIOGEL PI ORTHO PRO SZ8 (GLOVE) ×2
GLOVE ORTHO TXT STRL SZ7.5 (GLOVE) ×2 IMPLANT
GLOVE PI ORTHO PRO STRL SZ8 (GLOVE) ×2 IMPLANT
GLOVE SURG ORTHO 8.0 STRL STRW (GLOVE) ×2 IMPLANT
GOWN STRL REUS W/ TWL XL LVL3 (GOWN DISPOSABLE) ×1 IMPLANT
GOWN STRL REUS W/TWL 2XL LVL3 (GOWN DISPOSABLE) ×2 IMPLANT
GOWN STRL REUS W/TWL XL LVL3 (GOWN DISPOSABLE) ×1
HOOD PEEL AWAY FACE SHEILD DIS (HOOD) ×4 IMPLANT
KIT BASIN OR (CUSTOM PROCEDURE TRAY) ×2 IMPLANT
KIT ROOM TURNOVER OR (KITS) ×2 IMPLANT
MANIFOLD NEPTUNE II (INSTRUMENTS) ×2 IMPLANT
NDL SAFETY ECLIPSE 18X1.5 (NEEDLE) ×1 IMPLANT
NEEDLE HYPO 18GX1.5 SHARP (NEEDLE) ×1
NS IRRIG 1000ML POUR BTL (IV SOLUTION) ×2 IMPLANT
PACK TOTAL JOINT (CUSTOM PROCEDURE TRAY) ×2 IMPLANT
PAD ARMBOARD 7.5X6 YLW CONV (MISCELLANEOUS) ×4 IMPLANT
PILLOW ABDUCTION HIP (SOFTGOODS) ×2 IMPLANT
PRESSURIZER FEMORAL UNIV (MISCELLANEOUS) IMPLANT
RETRIEVER SUT HEWSON (MISCELLANEOUS) ×2 IMPLANT
SUCTION FRAZIER HANDLE 10FR (MISCELLANEOUS) ×1
SUCTION TUBE FRAZIER 10FR DISP (MISCELLANEOUS) ×1 IMPLANT
SUT FIBERWIRE #2 38 REV NDL BL (SUTURE) ×6
SUT VIC AB 0 CT1 27 (SUTURE) ×2
SUT VIC AB 0 CT1 27XBRD ANBCTR (SUTURE) ×2 IMPLANT
SUT VIC AB 2-0 CT1 27 (SUTURE) ×2
SUT VIC AB 2-0 CT1 TAPERPNT 27 (SUTURE) ×2 IMPLANT
SUT VIC AB 3-0 SH 8-18 (SUTURE) ×2 IMPLANT
SUTURE FIBERWR#2 38 REV NDL BL (SUTURE) ×3 IMPLANT
SYR CONTROL 10ML LL (SYRINGE) ×2 IMPLANT
TOWEL OR 17X24 6PK STRL BLUE (TOWEL DISPOSABLE) ×2 IMPLANT
TOWEL OR 17X26 10 PK STRL BLUE (TOWEL DISPOSABLE) ×2 IMPLANT
TRAY CATH 16FR W/PLASTIC CATH (SET/KITS/TRAYS/PACK) IMPLANT
TRAY FOLEY CATH SILVER 14FR (SET/KITS/TRAYS/PACK) IMPLANT

## 2016-11-26 NOTE — Anesthesia Postprocedure Evaluation (Signed)
Anesthesia Post Note  Patient: Hannah Daugherty  Procedure(s) Performed: Procedure(s) (LRB): RIGHT TOTAL HIP ARTHROPLASTY (Right)     Patient location during evaluation: PACU Anesthesia Type: General Level of consciousness: awake and alert and oriented Pain management: pain level controlled Vital Signs Assessment: post-procedure vital signs reviewed and stable Respiratory status: spontaneous breathing, nonlabored ventilation, respiratory function stable and patient connected to nasal cannula oxygen Cardiovascular status: blood pressure returned to baseline and stable Postop Assessment: no signs of nausea or vomiting Anesthetic complications: no    Last Vitals:  Vitals:   11/26/16 1107 11/26/16 1117  BP: (!) 162/92   Pulse: (!) 59 (!) 58  Resp: 16 (!) 9  Temp:      Last Pain:  Vitals:   11/26/16 1117  PainSc: 5                  Porter Nakama A.

## 2016-11-26 NOTE — Evaluation (Signed)
Physical Therapy Evaluation Patient Details Name: Hannah Daugherty MRN: [id number] DOB: 01-28-1961 Today's Date: 11/26/2016   History of Present Illness  Pt is 56 y/o female s/p elective R THA with posterior hip precautions. PMH includes R ankle surgery and arthritis.   Clinical Impression  Pt is s/p surgery above with deficits below. PTA, pt was independent with functional mobility. Upon evaluation, pt experiencing post op pain and weakness as well as nausea which limited participation in mobility. Requiring min guard to min A for safety throughout mobility. Pt reports friends and family will be available to help at d/c. Equipment recommendations below, and follow up PT per MD arrangements. Will continue to follow to progress mobility.     Follow Up Recommendations DC plan and follow up therapy as arranged by surgeon;Supervision/Assistance - 24 hour    Equipment Recommendations  Rolling walker with 5" wheels;3in1 (PT)    Recommendations for Other Services       Precautions / Restrictions Precautions Precautions: Posterior Hip Precaution Booklet Issued: Yes (comment) Precaution Comments: Reviewed precautions and supine ther ex with pt.  Restrictions Weight Bearing Restrictions: Yes RLE Weight Bearing: Weight bearing as tolerated      Mobility  Bed Mobility Overal bed mobility: Needs Assistance Bed Mobility: Supine to Sit     Supine to sit: Min assist     General bed mobility comments: Min A for RLE management. Verbal cues for sequencing to maintain posterior hip precautions.   Transfers Overall transfer level: Needs assistance Equipment used: Rolling walker (2 wheeled) Transfers: Sit to/from Stand Sit to Stand: Min assist         General transfer comment: Min A for steadying upon standing. Verbal cues for safe hand placement.   Ambulation/Gait Ambulation/Gait assistance: Min guard Ambulation Distance (Feet): 10 Feet Assistive device: Rolling walker (2 wheeled) Gait  Pattern/deviations: Step-to pattern;Decreased step length - right;Decreased weight shift to right;Antalgic Gait velocity: Decreased Gait velocity interpretation: Below normal speed for age/gender General Gait Details: Slow, antalgic gait secondary to post op pain and weakness. Verbal cues for sequencing with use of RW. Distance limited secondary to pain and nausea.   Stairs            Wheelchair Mobility    Modified Rankin (Stroke Patients Only)       Balance Overall balance assessment: Needs assistance Sitting-balance support: No upper extremity supported;Feet supported Sitting balance-Leahy Scale: Good     Standing balance support: Bilateral upper extremity supported;During functional activity Standing balance-Leahy Scale: Poor Standing balance comment: Reliant on RW for stability                              Pertinent Vitals/Pain Pain Assessment: 0-10 Pain Score: 4  Pain Location: R hip  Pain Descriptors / Indicators: Aching;Sore;Operative site guarding Pain Intervention(s): Limited activity within patient's tolerance;Monitored during session;Repositioned    Home Living Family/patient expects to be discharged to:: Private residence Living Arrangements: Alone Available Help at Discharge: Family;Available 24 hours/day;Friend(s) Type of Home: House Home Access: Level entry     Home Layout: One level Home Equipment: Crutches;Shower seat      Prior Function Level of Independence: Independent         Comments: 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 Able to perform exercise below. Deficits consistent with post op pain and weakness.     Cervical / Trunk  Assessment Cervical / Trunk Assessment: Normal  Communication   Communication: No difficulties  Cognition Arousal/Alertness: Suspect due to medications (very sleepy ) Behavior During Therapy: WFL for tasks assessed/performed Overall Cognitive Status: Within Functional Limits for tasks assessed                                        General Comments General comments (skin integrity, edema, etc.): Pt friend and sister in law present during session    Exercises Total Joint Exercises Ankle Circles/Pumps: AROM;Both;10 reps;Supine Quad Sets: AROM;Right;10 reps;Supine Short Arc Quad: AROM;Right;10 reps;Supine Heel Slides: AROM;Right;5 reps;Supine Hip ABduction/ADduction: AROM;Right;10 reps;Supine   Assessment/Plan    PT Assessment Patient needs continued PT services  PT Problem List Decreased strength;Decreased range of motion;Decreased activity tolerance;Decreased balance;Decreased mobility;Decreased knowledge of use of DME;Decreased knowledge of precautions;Pain       PT Treatment Interventions DME instruction;Gait training;Functional mobility training;Therapeutic activities;Therapeutic exercise;Balance training;Neuromuscular re-education;Patient/family education    PT Goals (Current goals can be found in the Care Plan section)  Acute Rehab PT Goals Patient Stated Goal: to regain independence  PT Goal Formulation: With patient Time For Goal Achievement: 12/03/16 Potential to Achieve Goals: Good    Frequency 7X/week   Barriers to discharge        Co-evaluation               AM-PAC PT "6 Clicks" Daily Activity  Outcome Measure Difficulty turning over in bed (including adjusting bedclothes, sheets and blankets)?: Total Difficulty moving from lying on back to sitting on the side of the bed? : Total Difficulty sitting down on and standing up from a chair with arms (e.g., wheelchair, bedside commode, etc,.)?: Total Help needed moving to and from a bed to  chair (including a wheelchair)?: A Little Help needed walking in hospital room?: A Little Help needed climbing 3-5 steps with a railing? : A Little 6 Click Score: 12    End of Session Equipment Utilized During Treatment: Gait belt Activity Tolerance: Treatment limited secondary to medical complications (Comment);Patient limited by pain (nausea) Patient left: in chair;with call bell/phone within reach;with family/visitor present Nurse Communication: Mobility status;Other (comment) (requesting nausea meds) PT Visit Diagnosis: Other abnormalities of gait and mobility (R26.89);Pain Pain - Right/Left: Right Pain - part of body: Hip    Time: [id number] PT Time Calculation (min) (ACUTE ONLY): 29 min   Charges:   PT Evaluation $PT Eval Low Complexity: 1 Procedure PT Treatments $Therapeutic Exercise: 8-22 mins   PT G Codes:        Margot ChimesBrittany Smith, PT, DPT  Acute Rehabilitation Services  Pager: [phone]   Melvyn NovasBrittany L Smith 11/26/2016, 4:19 PM

## 2016-11-26 NOTE — Transfer of Care (Signed)
Immediate Anesthesia Transfer of Care Note  Patient: Hannah Daugherty  Procedure(s) Performed: Procedure(s): RIGHT TOTAL HIP ARTHROPLASTY (Right)  Patient Location: PACU  Anesthesia Type:General  Level of Consciousness: awake  Airway & Oxygen Therapy: Patient Spontanous Breathing and Patient connected to nasal cannula oxygen  Post-op Assessment: Report given to RN and Post -op Vital signs reviewed and stable  Post vital signs: Reviewed and stable  Last Vitals:  Vitals:   11/26/16 0644 11/26/16 1024  BP:    Pulse:    Resp:    Temp: 36.7 C (P) 36.6 C    Last Pain:  Vitals:   11/26/16 1024  PainSc: (P) 0-No pain      Patients Stated Pain Goal: 2 (11/26/16 [id number])  Complications: No apparent anesthesia complications

## 2016-11-26 NOTE — Evaluation (Signed)
Occupational Therapy Evaluation Patient Details Name: Hannah Daugherty MRN: [id number] DOB: 08/14/1960 Today's Date: 11/26/2016    History of Present Illness Pt is 56 y/o female s/p elective R THA with posterior hip precautions. PMH includes R ankle surgery and arthritis.    Clinical Impression   PTA, pt was independent with ADL and functional mobility and working. She does report to OT that she was utilizing crutches the day prior to her surgery. Pt currently requires max assist for LB ADL and min guard assist for toilet transfers. She presents with post-operative pain and was lethargic likely due to medications this session. Initiated education concerning posterior hip precautions and compensatory strategies for ADL and pt would benefit from continued reinforcement. She would benefit from continued OT services while admitted in order to improve independence with ADL and functional mobility in preparation for return home with 24 hour assistance and no OT follow-up. OT will continue to follow while admitted with a focus on AE education for LB ADL and tub transfers.     Follow Up Recommendations  No OT follow up;Supervision/Assistance - 24 hour    Equipment Recommendations  3 in 1 bedside commode    Recommendations for Other Services       Precautions / Restrictions Precautions Precautions: Posterior Hip Precaution Booklet Issued: No Precaution Comments: Handout previously provided by PT and in room. Pt able to recall 1/3. OT educated pt concerning posterior hip precautions during ADL tasks.  Restrictions Weight Bearing Restrictions: Yes RLE Weight Bearing: Weight bearing as tolerated      Mobility Bed Mobility Overal bed mobility: Needs Assistance Bed Mobility: Sit to Supine     Supine to sit: Min assist Sit to supine: Mod assist   General bed mobility comments: Mod assist for managing B LE back into bed.   Transfers Overall transfer level: Needs assistance Equipment used:  Rolling walker (2 wheeled) Transfers: Sit to/from Stand Sit to Stand: Min guard         General transfer comment: VC's for adhering to back precautions. Min guard assist for steadying.     Balance Overall balance assessment: Needs assistance Sitting-balance support: No upper extremity supported;Feet supported Sitting balance-Leahy Scale: Good     Standing balance support: Bilateral upper extremity supported;During functional activity;Single extremity supported Standing balance-Leahy Scale: Poor Standing balance comment: Reliant on single UE support from RW for stability                            ADL either performed or assessed with clinical judgement   ADL Overall ADL's : Needs assistance/impaired Eating/Feeding: Set up;Sitting   Grooming: Min guard;Standing   Upper Body Bathing: Set up;Sitting   Lower Body Bathing: Maximal assistance;Sit to/from stand   Upper Body Dressing : Set up;Sitting   Lower Body Dressing: Maximal assistance;Sit to/from stand   Toilet Transfer: Min guard;Ambulation;BSC;RW   Toileting- ArchitectClothing Manipulation and Hygiene: Min guard;Sit to/from stand       Functional mobility during ADLs: Min guard;Rolling walker General ADL Comments: VC's for adhering to posterior hip precautions throughout session. Initiated education concerning use of AE for LB ADL and plan to continue this next session.      Vision Patient Visual Report: No change from baseline Vision Assessment?: No apparent visual deficits     Perception     Praxis      Pertinent Vitals/Pain Pain Assessment: Faces Pain Score: 4  Faces Pain Scale: Hurts even more Pain  Location: R hip with movement Pain Descriptors / Indicators: Aching;Sore;Operative site guarding Pain Intervention(s): Monitored during session;Limited activity within patient's tolerance;Repositioned     Hand Dominance Right   Extremity/Trunk Assessment Upper Extremity Assessment Upper Extremity  Assessment: Overall WFL for tasks assessed   Lower Extremity Assessment Lower Extremity Assessment: RLE deficits/detail RLE Deficits / Details: Decreased strength and ROM as expected post-operatively.    Cervical / Trunk Assessment Cervical / Trunk Assessment: Normal   Communication Communication Communication: No difficulties   Cognition Arousal/Alertness: Suspect due to medications;Lethargic Behavior During Therapy: WFL for tasks assessed/performed Overall Cognitive Status: Within Functional Limits for tasks assessed                                     General Comments  Pt's friend present throughout session    Exercises Exercises: Total Joint Total Joint Exercises Ankle Circles/Pumps: AROM;Both;10 reps;Supine Quad Sets: AROM;Right;10 reps;Supine Short Arc Quad: AROM;Right;10 reps;Supine Heel Slides: AROM;Right;5 reps;Supine Hip ABduction/ADduction: AROM;Right;10 reps;Supine   Shoulder Instructions      Home Living Family/patient expects to be discharged to:: Private residence Living Arrangements: Alone Available Help at Discharge: Available 24 hours/day;Friend(s) Type of Home: House Home Access: Level entry     Home Layout: One level     Bathroom Shower/Tub: Chief Strategy Officer: Standard     Home Equipment: Crutches;Shower seat          Prior Functioning/Environment Level of Independence: Independent        Comments: Still works for hospice of [employer]         OT Problem List: Decreased strength;Decreased range of motion;Decreased activity tolerance;Impaired balance (sitting and/or standing);Decreased safety awareness;Decreased knowledge of use of DME or AE;Decreased knowledge of precautions;Pain      OT Treatment/Interventions: Self-care/ADL training;Therapeutic exercise;Energy conservation;DME and/or AE instruction;Therapeutic activities;Patient/family education;Balance training    OT Goals(Current goals can  be found in the care plan section) Acute Rehab OT Goals Patient Stated Goal: to regain independence  OT Goal Formulation: With patient Time For Goal Achievement: 12/10/16 Potential to Achieve Goals: Good  OT Frequency: Min 2X/week   Barriers to D/C:            Co-evaluation              AM-PAC PT "6 Clicks" Daily Activity     Outcome Measure Help from another person eating meals?: None Help from another person taking care of personal grooming?: A Little Help from another person toileting, which includes using toliet, bedpan, or urinal?: A Little Help from another person bathing (including washing, rinsing, drying)?: A Little Help from another person to put on and taking off regular upper body clothing?: None Help from another person to put on and taking off regular lower body clothing?: A Little 6 Click Score: 20   End of Session Equipment Utilized During Treatment: Gait belt;Rolling walker  Activity Tolerance: Patient tolerated treatment well Patient left: in bed;with family/visitor present  OT Visit Diagnosis: Other abnormalities of gait and mobility (R26.89);Pain Pain - Right/Left: Right Pain - part of body: Hip                Time: 1610-9604 OT Time Calculation (min): 25 min Charges:  OT General Charges $OT Visit: 1 Procedure OT Evaluation $OT Eval Moderate Complexity: 1 Procedure OT Treatments $Self Care/Home Management : 8-22 mins G-Codes:     Hannah Moxley A Alleyne Lac, MS OTR/L  Pager: [phone]   Hannah Daugherty 11/26/2016, 5:39 PM

## 2016-11-26 NOTE — Op Note (Signed)
11/26/2016  10:01 AM  PATIENT:  Hannah Daugherty   MRN: [id number]  PRE-OPERATIVE DIAGNOSIS:  Primary localized osteoarthritis of right hip  POST-OPERATIVE DIAGNOSIS:  Primary localized osteoarthritis of right hip  PROCEDURE:  Procedure(s): RIGHT TOTAL HIP ARTHROPLASTY  PREOPERATIVE INDICATIONS:    AMITA ATAYDE is an 56 y.o. female who has a diagnosis of Primary localized osteoarthritis of right hip and elected for surgical management after failing conservative treatment.  The risks benefits and alternatives were discussed with the patient including but not limited to the risks of nonoperative treatment, versus surgical intervention including infection, bleeding, nerve injury, periprosthetic fracture, the need for revision surgery, dislocation, leg length discrepancy, blood clots, cardiopulmonary complications, morbidity, mortality, among others, and they were willing to proceed.     OPERATIVE REPORT     SURGEON:  Marchia Bond, MD    ASSISTANT:  Joya Gaskins, OPA-C  (Present throughout the entire procedure,  necessary for completion of procedure in a timely manner, assisting with retraction, instrumentation, and closure)     ANESTHESIA:  Gen.  ESTIMATED BLOOD LOSS: 409 mL    COMPLICATIONS:  None.     UNIQUE ASPECTS OF THE CASE:  The hip had a fair amount of flexibility in the tissue was not particularly contracted. The head was collapsed, and the articular cartilage denuded. There was complete chondral loss. Initially, I did not achieve complete seating of the acetabular cup, removed it, reamed line to line, and then it sat fully down, and had good fixation. I did not feel that I needed a screw.  The femur length was restored with a +1 head ball, although the reduction was quite easy, it was very stable throughout functional range of motion, and the leg lengths felt equal. Soft tissue tension and appropriate.  COMPONENTS:  [employer] fit femur size 3 with a 32 mm + 1 head  ball and a gription acetabular shell size 50 with an apex hole eliminator and a  +4 neutral polyethylene liner.    PROCEDURE IN DETAIL:   The patient was met in the holding area and  identified.  The appropriate hip was identified and marked at the operative site.  The patient was then transported to the OR  and  placed under anesthesia.  At that point, the patient was  placed in the lateral decubitus position with the operative side up and  secured to the operating room table and all bony prominences padded.     The operative lower extremity was prepped from the iliac crest to the distal leg.  Sterile draping was performed.  Time out was performed prior to incision.      A routine posterolateral approach was utilized via sharp dissection  carried down to the subcutaneous tissue.  Gross bleeders were Bovie coagulated.  The iliotibial band was identified and incised along the length of the skin incision.  Self-retaining retractors were  inserted.  With the hip internally rotated, the short external rotators  were identified. The piriformis and capsule was tagged with FiberWire, and the hip capsule released in a T-type fashion.  The femoral neck was exposed, and I resected the femoral neck using the appropriate jig. This was performed at approximately a thumb's breadth above the lesser trochanter.    I then exposed the deep acetabulum, cleared out any tissue including the ligamentum teres.  A wing retractor was placed.  After adequate visualization, I excised the labrum, and then sequentially reamed.  I placed the trial acetabulum,  which seated with a little difficulty, and then impacted the real cup into place.  Initially, it did not sit down fully, so I reamed line to line with the 50 mm reamer, and then replaced the cup, and it had full seating and excellent fixation and was matching her bony anatomy.   Appropriate version and inclination was confirmed clinically matching their bony anatomy, and also  with the use of the jig.  A trial polyethylene liner was placed and the wing retractor removed.    I then prepared the proximal femur using the cookie-cutter, the lateralizing reamer, and then sequentially reamed and broached.  A trial broach, neck, and head was utilized, and I reduced the hip and it was found to have excellent stability with functional range of motion. The trial components were then removed, and the real polyethylene liner was placed.  I then impacted the real femoral prosthesis into place into the appropriate version, slightly anteverted to the normal anatomy, and I impacted the real head ball into place. The hip was then reduced and taken through functional range of motion and found to have excellent stability. Leg lengths were restored.  I then used a 2 mm drill bits to pass the FiberWire suture from the capsule and piriformis through the greater trochanter, and secured this. Excellent posterior capsular repair was achieved. I also closed the T in the capsule.  I then irrigated the hip copiously again with pulse lavage, and repaired the fascia with Vicryl, followed by Vicryl for the subcutaneous tissue, Monocryl for the skin, Steri-Strips and sterile gauze. The wounds were injected. The patient was then awakened and returned to PACU in stable and satisfactory condition. There were no complications.  Marchia Bond, MD Orthopedic Surgeon [phone]   11/26/2016 10:01 AM

## 2016-11-26 NOTE — H&P (Signed)
PREOPERATIVE H&P  Chief Complaint: DJD RIGHT HIP  HPI: Hannah Daugherty is a 56 y.o. female who presents for preoperative history and physical with a diagnosis of DJD RIGHT HIP. Symptoms are rated as moderate to severe, and have been worsening.  This is significantly impairing activities of daily living.  She has elected for surgical management.   She has failed injections, activity modification, anti-inflammatories, and assistive devices.  Preoperative X-rays demonstrate end stage degenerative changes with osteophyte formation, loss of joint space, subchondral sclerosis.   Past Medical History:  Diagnosis Date  . Arthritis    Past Surgical History:  Procedure Laterality Date  . ABDOMINAL HYSTERECTOMY    . ANKLE SURGERY Right 09/2015  . CHOLECYSTECTOMY    . RECTOCELE REPAIR    . TUBAL LIGATION     Social History   Social History  . Marital status: Widowed    Spouse name: N/A  . Number of children: N/A  . Years of education: N/A   Social History Main Topics  . Smoking status: Never Smoker  . Smokeless tobacco: Never Used  . Alcohol use No  . Drug use: No  . Sexual activity: Yes    Birth control/ protection: Surgical   Other Topics Concern  . None   Social History Narrative  . None   Family History  Problem Relation Age of Onset  . Diabetes Mother   . Aneurysm Father   . Diabetes Maternal Grandfather   . Aneurysm Paternal Grandmother   . Aneurysm Paternal Grandfather   . Hypertension Sister   . Cancer Brother        scrotum   Allergies  Allergen Reactions  . Codeine Nausea Only   Prior to Admission medications   Medication Sig Start Date End Date Taking? Authorizing Provider  OVER THE COUNTER MEDICATION Take 1 tablet by mouth daily. "Herbal Life"   Yes [provider]  OVER THE COUNTER MEDICATION Take 1 tablet by mouth 2 (two) times daily. Celluloss   Yes [provider]     Positive ROS: All other systems have been reviewed and were  otherwise negative with the exception of those mentioned in the HPI and as above.  Physical Exam: General: Alert, no acute distress Cardiovascular: No pedal edema Respiratory: No cyanosis, no use of accessory musculature GI: No organomegaly, abdomen is soft and non-tender Skin: No lesions in the area of chief complaint Neurologic: Sensation intact distally Psychiatric: Patient is competent for consent with normal mood and affect Lymphatic: No axillary or cervical lymphadenopathy  MUSCULOSKELETAL: right hip AROM 0-80 with 0 IR, 5 ER, painful arc, EHL intact.  Assessment: DJD RIGHT HIP   Plan: Plan for Procedure(s): RIGHT TOTAL HIP ARTHROPLASTY  The risks benefits and alternatives were discussed with the patient including but not limited to the risks of nonoperative treatment, versus surgical intervention including infection, bleeding, nerve injury, periprosthetic fracture, the need for revision surgery, dislocation, leg length discrepancy, blood clots, cardiopulmonary complications, morbidity, mortality, among others, and they were willing to proceed.     Eulas PostLANDAU,Fantasha Daniele P, MD Cell [phone]   11/26/2016 7:15 AM

## 2016-11-26 NOTE — Anesthesia Procedure Notes (Addendum)
Procedure Name: Intubation Date/Time: 11/26/2016 7:36 AM Performed by: Manus Gunning, Asma Boldon J Pre-anesthesia Checklist: Patient identified, Emergency Drugs available, Suction available and Timeout performed Patient Re-evaluated:Patient Re-evaluated prior to inductionOxygen Delivery Method: Circle system utilized Preoxygenation: Pre-oxygenation with 100% oxygen Intubation Type: IV induction Ventilation: Mask ventilation without difficulty Laryngoscope Size: Mac and 3 Grade View: Grade I Tube type: Oral Tube size: 7.0 mm Number of attempts: 1 Placement Confirmation: ETT inserted through vocal cords under direct vision,  positive ETCO2 and breath sounds checked- equal and bilateral Secured at: 21 cm Tube secured with: Tape

## 2016-11-27 ENCOUNTER — Encounter (HOSPITAL_COMMUNITY): Payer: Self-pay | Admitting: Orthopedic Surgery

## 2016-11-27 LAB — CBC
HCT: 31.6 % — ABNORMAL LOW (ref 36.0–46.0)
Hemoglobin: 10.4 g/dL — ABNORMAL LOW (ref 12.0–15.0)
MCH: 32.1 pg (ref 26.0–34.0)
MCHC: 32.9 g/dL (ref 30.0–36.0)
MCV: 97.5 fL (ref 78.0–100.0)
PLATELETS: 152 10*3/uL (ref 150–400)
RBC: 3.24 MIL/uL — ABNORMAL LOW (ref 3.87–5.11)
RDW: 13.1 % (ref 11.5–15.5)
WBC: 10.2 10*3/uL (ref 4.0–10.5)

## 2016-11-27 LAB — BASIC METABOLIC PANEL
Anion gap: 8 (ref 5–15)
BUN: 11 mg/dL (ref 6–20)
CHLORIDE: 102 mmol/L (ref 101–111)
CO2: 26 mmol/L (ref 22–32)
CREATININE: 0.98 mg/dL (ref 0.44–1.00)
Calcium: 8.2 mg/dL — ABNORMAL LOW (ref 8.9–10.3)
GFR calc Af Amer: >60 mL/min (ref >60)
GFR calc non Af Amer: >60 mL/min (ref >60)
GLUCOSE: 107 mg/dL — AB (ref 65–99)
Potassium: 3.4 mmol/L — ABNORMAL LOW (ref 3.5–5.1)
SODIUM: 136 mmol/L (ref 135–145)

## 2016-11-27 NOTE — Progress Notes (Signed)
Tx performed and charted by SPT under direct guidance and supervision of PTA at all times.  Charting reviewed for accuracy and depicts tx performed and services provided.  Joycelyn Rua, PTA pager [phone]   Physical Therapy Treatment Patient Details Name: Hannah Daugherty MRN: [id number] DOB: 01/21/1961 Today's Date: 11/27/2016    History of Present Illness Pt is 56 y/o female s/p elective R THA with posterior hip precautions. PMH includes R ankle surgery and arthritis.     PT Comments    Pt tolerated treatment well and displayed improvement from earlier session. Today's session focused on increase functional mobility and completing standing therex for HEP. Please complete stair training tomorrow with one stair, follow-up on posterior hip precautions, and review standing HEP exercises.     Follow Up Recommendations  DC plan and follow up therapy as arranged by surgeon;Supervision/Assistance - 24 hour     Equipment Recommendations  Rolling walker with 5" wheels;3in1 (PT)    Recommendations for Other Services OT consult     Precautions / Restrictions Precautions Precautions: Posterior Hip Precaution Booklet Issued: No Precaution Comments: Asked pt to report precautions back. Pt able to recite them, but could use reinforcement  Restrictions Weight Bearing Restrictions: Yes RLE Weight Bearing: Weight bearing as tolerated    Mobility  Bed Mobility          General bed mobility comments: Pt in chair upon arrival  Transfers Overall transfer level: Needs assistance Equipment used: Rolling walker (2 wheeled) Transfers: Sit to/from Stand Sit to Stand: Min guard         General transfer comment: VC for hand placement to increase safety  Ambulation/Gait Ambulation/Gait assistance: Min guard Ambulation Distance (Feet): 200 Feet Assistive device: Rolling walker (2 wheeled) Gait Pattern/deviations: Step-to pattern;Decreased step length - right;Decreased weight shift to  right;Antalgic;Step-through pattern Gait velocity: Decreased Gait velocity interpretation: Below normal speed for age/gender General Gait Details: Vc for increased stance on R LE and increased stride length on L LE. Cues for normalized gait pattern   Stairs            Wheelchair Mobility    Modified Rankin (Stroke Patients Only)       Balance Overall balance assessment: Needs assistance Sitting-balance support: No upper extremity supported;Feet supported Sitting balance-Leahy Scale: Good     Standing balance support: Bilateral upper extremity supported;No upper extremity supported;During functional activity Standing balance-Leahy Scale: Fair Standing balance comment: Able to complete standing pericare without UE support.                             Cognition Arousal/Alertness: Awake/alert Behavior During Therapy: WFL for tasks assessed/performed Overall Cognitive Status: Within Functional Limits for tasks assessed                                        Exercises Total Joint Exercises Hip ABduction/ADduction: AROM;Right;10 reps;Standing Knee Flexion: AROM;Right;10 reps;Standing Marching in Standing: AROM;Right;10 reps;Standing Standing Hip Extension: AROM;Right;10 reps;Standing    General Comments        Pertinent Vitals/Pain Pain Assessment: 0-10 Pain Score: 2  Faces Pain Scale: Hurts even more Pain Location: R hip at the end of session Pain Descriptors / Indicators: Aching;Sore;Operative site guarding Pain Intervention(s): Monitored during session;Repositioned    Home Living  Prior Function            PT Goals (current goals can now be found in the care plan section) Acute Rehab PT Goals Patient Stated Goal: to regain independence  PT Goal Formulation: With patient Time For Goal Achievement: 12/03/16 Potential to Achieve Goals: Good Progress towards PT goals: Progressing toward goals     Frequency    7X/week      PT Plan Current plan remains appropriate    Co-evaluation              AM-PAC PT "6 Clicks" Daily Activity  Outcome Measure  Difficulty turning over in bed (including adjusting bedclothes, sheets and blankets)?: A Lot Difficulty moving from lying on back to sitting on the side of the bed? : A Lot Difficulty sitting down on and standing up from a chair with arms (e.g., wheelchair, bedside commode, etc,.)?: A Lot Help needed moving to and from a bed to chair (including a wheelchair)?: A Little Help needed walking in hospital room?: A Little Help needed climbing 3-5 steps with a railing? : A Little 6 Click Score: 15    End of Session Equipment Utilized During Treatment: Gait belt Activity Tolerance: Patient tolerated treatment well Patient left: in chair;with call bell/phone within reach Nurse Communication: Mobility status PT Visit Diagnosis: Other abnormalities of gait and mobility (R26.89);Pain Pain - Right/Left: Right Pain - part of body: Hip     Time: [id number] PT Time Calculation (min) (ACUTE ONLY): 24 min  Charges:  $Gait Training: 8-22 mins $Therapeutic Exercise: 8-22 mins                    G Codes:       Olin HauserJenna Silver, SPT [phone]    Eileen StanfordJenna Silver 11/27/2016, 4:03 PM

## 2016-11-27 NOTE — Progress Notes (Signed)
Occupational Therapy Treatment Patient Details Name: Hannah Daugherty MRN: [id number] DOB: 10/25/1960 Today's Date: 11/27/2016    History of present illness Pt is 56 y/o female s/p elective R THA with posterior hip precautions. PMH includes R ankle surgery and arthritis.    OT comments  Pt progressing well toward OT goals. She was more alert this session and demonstrates good understanding of posterior hip precautions although she does require VC's to adhere to these at times during novel tasks such as tub/shower transfers. Pt was able to complete LB ADL with AE with min assist and tub and toilet transfers with min guard assist this session. Pt would continue to benefit from OT services while admitted to improve independence and activity tolerance for ADL in preparation for return home with assist from family and friends.    Follow Up Recommendations  No OT follow up;Supervision/Assistance - 24 hour    Equipment Recommendations  3 in 1 bedside commode    Recommendations for Other Services      Precautions / Restrictions Precautions Precautions: Posterior Hip Precaution Booklet Issued: No Precaution Comments: Pt demonstrates good understanding of posterior hip precautions during ADL.  Restrictions Weight Bearing Restrictions: Yes RLE Weight Bearing: Weight bearing as tolerated       Mobility Bed Mobility Overal bed mobility: Needs Assistance Bed Mobility: Supine to Sit     Supine to sit: Min guard     General bed mobility comments: Min guard for safety with VC's for posterior hip precautions during bed mobility.   Transfers Overall transfer level: Needs assistance Equipment used: Rolling walker (2 wheeled) Transfers: Sit to/from Stand Sit to Stand: Min guard              Balance Overall balance assessment: Needs assistance Sitting-balance support: No upper extremity supported;Feet supported Sitting balance-Leahy Scale: Good     Standing balance support: Bilateral  upper extremity supported;No upper extremity supported;During functional activity Standing balance-Leahy Scale: Fair Standing balance comment: Able to complete standing pericare without UE support.                            ADL either performed or assessed with clinical judgement   ADL Overall ADL's : Needs assistance/impaired             Lower Body Bathing: Minimal assistance;With adaptive equipment       Lower Body Dressing: Minimal assistance;Sit to/from stand;With adaptive equipment   Toilet Transfer: Min guard;Ambulation;BSC;RW   Toileting- ArchitectClothing Manipulation and Hygiene: Min guard;Sit to/from stand   Tub/ Shower Transfer: Min guard;Ambulation;3 in 1;Tub transfer Tub/Shower Transfer Details (indicate cue type and reason): Educated on method to complete while adhering to posterior hip precautions.  Functional mobility during ADLs: Min guard;Rolling walker General ADL Comments: Pt educated concerning use of AE for LB ADL and safe shower transfers while adhering to posterior hip precautions. Family present at end of session for AE education.      Vision   Vision Assessment?: No apparent visual deficits   Perception     Praxis      Cognition Arousal/Alertness: Awake/alert Behavior During Therapy: WFL for tasks assessed/performed Overall Cognitive Status: Within Functional Limits for tasks assessed                                          Exercises  Shoulder Instructions       General Comments      Pertinent Vitals/ Pain       Pain Assessment: Faces Faces Pain Scale: Hurts even more Pain Location: R hip with movement Pain Descriptors / Indicators: Aching;Sore;Operative site guarding Pain Intervention(s): Limited activity within patient's tolerance;Monitored during session;Repositioned  Home Living                                          Prior Functioning/Environment              Frequency  Min  2X/week        Progress Toward Goals  OT Goals(current goals can now be found in the care plan section)  Progress towards OT goals: Progressing toward goals  Acute Rehab OT Goals Patient Stated Goal: to regain independence  OT Goal Formulation: With patient Time For Goal Achievement: 12/10/16 Potential to Achieve Goals: Good  Plan Discharge plan remains appropriate    Co-evaluation                 AM-PAC PT "6 Clicks" Daily Activity     Outcome Measure   Help from another person eating meals?: None Help from another person taking care of personal grooming?: A Little Help from another person toileting, which includes using toliet, bedpan, or urinal?: A Little Help from another person bathing (including washing, rinsing, drying)?: A Little Help from another person to put on and taking off regular upper body clothing?: None Help from another person to put on and taking off regular lower body clothing?: A Little 6 Click Score: 20    End of Session Equipment Utilized During Treatment: Gait belt;Rolling walker  OT Visit Diagnosis: Other abnormalities of gait and mobility (R26.89);Pain Pain - Right/Left: Right Pain - part of body: Hip   Activity Tolerance Patient tolerated treatment well   Patient Left in bed;with family/visitor present   Nurse Communication          Time: 1324-4010 OT Time Calculation (min): 30 min  Charges: OT General Charges $OT Visit: 1 Procedure OT Treatments $Self Care/Home Management : 23-37 mins  Doristine Section, MS OTR/L  Pager: [phone]    Rontrell Moquin A Avyanna Spada 11/27/2016, 9:12 AM

## 2016-11-27 NOTE — Progress Notes (Signed)
Physical Therapy Treatment Patient Details Name: Hannah Daugherty [place] MRN: [id number] DOB: 06/25/1960 Today's Date: 11/27/2016    History of Present Illness Pt is 56 y/o female s/p elective R THA with posterior hip precautions. PMH includes R ankle surgery and arthritis.     PT Comments     Pt performed increased gait and functional mobility during session.  Plan to review/teach standing exercises.  Plan to return home remains appropriate.    Follow Up Recommendations  DC plan and follow up therapy as arranged by surgeon;Supervision/Assistance - 24 hour     Equipment Recommendations  Rolling walker with 5" wheels;3in1 (PT)    Recommendations for Other Services OT consult     Precautions / Restrictions Precautions Precautions: Posterior Hip Precaution Booklet Issued: No Precaution Comments: Pt required review of hip precautions pre-mobility.   Restrictions Weight Bearing Restrictions: Yes RLE Weight Bearing: Weight bearing as tolerated    Mobility  Bed Mobility Overal bed mobility: Needs Assistance Bed Mobility: Supine to Sit     Supine to sit: Supervision     General bed mobility comments: Pt required increased time and cueing for posterior hip precautions but able to manage without assistance.    Transfers Overall transfer level: Needs assistance Equipment used: Rolling walker (2 wheeled) Transfers: Sit to/from Stand Sit to Stand: Min guard         General transfer comment: VC's for adhering to posterior hip precautions. Min guard assist for steadying.   Ambulation/Gait Ambulation/Gait assistance: Min guard Ambulation Distance (Feet): 200 Feet Assistive device: Rolling walker (2 wheeled) Gait Pattern/deviations: Step-to pattern;Decreased step length - right;Decreased weight shift to right;Antalgic;Step-through pattern Gait velocity: Decreased Gait velocity interpretation: Below normal speed for age/gender General Gait Details: Cues for progression to step through  pattern.  Cues for upper trunk control, maintaining positioning with in RW.  Pt fatigues with activity.     Stairs            Wheelchair Mobility    Modified Rankin (Stroke Patients Only)       Balance Overall balance assessment: Needs assistance Sitting-balance support: No upper extremity supported;Feet supported Sitting balance-Leahy Scale: Good     Standing balance support: Bilateral upper extremity supported;No upper extremity supported;During functional activity Standing balance-Leahy Scale: Fair Standing balance comment: Able to complete standing pericare without UE support.                             Cognition Arousal/Alertness: Awake/alert Behavior During Therapy: WFL for tasks assessed/performed Overall Cognitive Status: Within Functional Limits for tasks assessed                                        Exercises Total Joint Exercises Ankle Circles/Pumps: AROM;Both;10 reps;Supine Quad Sets: AROM;Right;10 reps;Supine Short Arc Quad: AROM;Right;10 reps;Supine Heel Slides: Right;5 reps;Supine;AAROM Hip ABduction/ADduction: Right;10 reps;Supine;AAROM Long Arc Quad: AROM;Right;10 reps;Seated    General Comments        Pertinent Vitals/Pain Pain Assessment: Faces Pain Score: 4  Faces Pain Scale: Hurts even more Pain Location: R hip with movement Pain Descriptors / Indicators: Aching;Sore;Operative site guarding Pain Intervention(s): Monitored during session;Repositioned    Home Living                      Prior Function  PT Goals (current goals can now be found in the care plan section) Acute Rehab PT Goals Patient Stated Goal: to regain independence  Potential to Achieve Goals: Good Progress towards PT goals: Progressing toward goals    Frequency    7X/week      PT Plan Current plan remains appropriate    Co-evaluation              AM-PAC PT "6 Clicks" Daily Activity  Outcome  Measure  Difficulty turning over in bed (including adjusting bedclothes, sheets and blankets)?: A Lot Difficulty moving from lying on back to sitting on the side of the bed? : A Lot Difficulty sitting down on and standing up from a chair with arms (e.g., wheelchair, bedside commode, etc,.)?: A Lot Help needed moving to and from a bed to chair (including a wheelchair)?: A Little Help needed walking in hospital room?: A Little Help needed climbing 3-5 steps with a railing? : A Little 6 Click Score: 15    End of Session Equipment Utilized During Treatment: Gait belt Activity Tolerance: Patient limited by fatigue;Patient limited by pain Patient left: in chair;with call bell/phone within reach;with family/visitor present Nurse Communication: Mobility status PT Visit Diagnosis: Other abnormalities of gait and mobility (R26.89);Pain Pain - Right/Left: Right Pain - part of body: Hip     Time: 1610-9604 PT Time Calculation (min) (ACUTE ONLY): 28 min  Charges:  $Gait Training: 8-22 mins $Therapeutic Exercise: 8-22 mins                    G Codes:       Joycelyn Rua, PTA pager [phone]    Florestine Avers 11/27/2016, 1:29 PM

## 2016-11-27 NOTE — Discharge Instructions (Signed)
INSTRUCTIONS AFTER JOINT REPLACEMENT  ° °o Remove items at home which could result in a fall. This includes throw rugs or furniture in walking pathways °o ICE to the affected joint every three hours while awake for 30 minutes at a time, for at least the first 3-5 days, and then as needed for pain and swelling.  Continue to use ice for pain and swelling. You may notice swelling that will progress down to the foot and ankle.  This is normal after surgery.  Elevate your leg when you are not up walking on it.   °o Continue to use the breathing machine you got in the hospital (incentive spirometer) which will help keep your temperature down.  It is common for your temperature to cycle up and down following surgery, especially at night when you are not up moving around and exerting yourself.  The breathing machine keeps your lungs expanded and your temperature down. ° ° °DIET:  As you were doing prior to hospitalization, we recommend a well-balanced diet. ° °DRESSING / WOUND CARE / SHOWERING ° °You may change your dressing 3-5 days after surgery.  Then change the dressing every day with sterile gauze.  Please use good hand washing techniques before changing the dressing.  Do not use any lotions or creams on the incision until instructed by your surgeon. ° °ACTIVITY ° °o Increase activity slowly as tolerated, but follow the weight bearing instructions below.   °o No driving for 6 weeks or until further direction given by your physician.  You cannot drive while taking narcotics.  °o No lifting or carrying greater than 10 lbs. until further directed by your surgeon. °o Avoid periods of inactivity such as sitting longer than an hour when not asleep. This helps prevent blood clots.  °o You may return to work once you are authorized by your doctor.  ° ° ° °WEIGHT BEARING  ° °Weight bearing as tolerated with assist device (walker, cane, etc) as directed, use it as long as suggested by your surgeon or therapist, typically at  least 4-6 weeks. ° ° °EXERCISES ° °Results after joint replacement surgery are often greatly improved when you follow the exercise, range of motion and muscle strengthening exercises prescribed by your doctor. Safety measures are also important to protect the joint from further injury. Any time any of these exercises cause you to have increased pain or swelling, decrease what you are doing until you are comfortable again and then slowly increase them. If you have problems or questions, call your caregiver or physical therapist for advice.  ° °Rehabilitation is important following a joint replacement. After just a few days of immobilization, the muscles of the leg can become weakened and shrink (atrophy).  These exercises are designed to build up the tone and strength of the thigh and leg muscles and to improve motion. Often times heat used for twenty to thirty minutes before working out will loosen up your tissues and help with improving the range of motion but do not use heat for the first two weeks following surgery (sometimes heat can increase post-operative swelling).  ° °These exercises can be done on a training (exercise) mat, on the floor, on a table or on a bed. Use whatever works the best and is most comfortable for you.    Use music or television while you are exercising so that the exercises are a pleasant break in your day. This will make your life better with the exercises acting as a break   in your routine that you can look forward to.   Perform all exercises about fifteen times, three times per day or as directed.  You should exercise both the operative leg and the other leg as well. ° °Exercises include: °  °• Quad Sets - Tighten up the muscle on the front of the thigh (Quad) and hold for 5-10 seconds.   °• Straight Leg Raises - With your knee straight (if you were given a brace, keep it on), lift the leg to 60 degrees, hold for 3 seconds, and slowly lower the leg.  Perform this exercise against  resistance later as your leg gets stronger.  °• Leg Slides: Lying on your back, slowly slide your foot toward your buttocks, bending your knee up off the floor (only go as far as is comfortable). Then slowly slide your foot back down until your leg is flat on the floor again.  °• Angel Wings: Lying on your back spread your legs to the side as far apart as you can without causing discomfort.  °• Hamstring Strength:  Lying on your back, push your heel against the floor with your leg straight by tightening up the muscles of your buttocks.  Repeat, but this time bend your knee to a comfortable angle, and push your heel against the floor.  You may put a pillow under the heel to make it more comfortable if necessary.  ° °A rehabilitation program following joint replacement surgery can speed recovery and prevent re-injury in the future due to weakened muscles. Contact your doctor or a physical therapist for more information on knee rehabilitation.  ° ° °CONSTIPATION ° °Constipation is defined medically as fewer than three stools per week and severe constipation as less than one stool per week.  Even if you have a regular bowel pattern at home, your normal regimen is likely to be disrupted due to multiple reasons following surgery.  Combination of anesthesia, postoperative narcotics, change in appetite and fluid intake all can affect your bowels.  ° °YOU MUST use at least one of the following options; they are listed in order of increasing strength to get the job done.  They are all available over the counter, and you may need to use some, POSSIBLY even all of these options:   ° °Drink plenty of fluids (prune juice may be helpful) and high fiber foods °Colace 100 mg by mouth twice a day  °Senokot for constipation as directed and as needed Dulcolax (bisacodyl), take with full glass of water  °Miralax (polyethylene glycol) once or twice a day as needed. ° °If you have tried all these things and are unable to have a bowel  movement in the first 3-4 days after surgery call either your surgeon or your primary doctor.   ° °If you experience loose stools or diarrhea, hold the medications until you stool forms back up.  If your symptoms do not get better within 1 week or if they get worse, check with your doctor.  If you experience "the worst abdominal pain ever" or develop nausea or vomiting, please contact the office immediately for further recommendations for treatment. ° ° °ITCHING:  If you experience itching with your medications, try taking only a single pain pill, or even half a pain pill at a time.  You can also use Benadryl over the counter for itching or also to help with sleep.  ° °TED HOSE STOCKINGS:  Use stockings on both legs until for at least 2 weeks or as   directed by physician office. They may be removed at night for sleeping. ° °MEDICATIONS:  See your medication summary on the “After Visit Summary” that nursing will review with you.  You may have some home medications which will be placed on hold until you complete the course of blood thinner medication.  It is important for you to complete the blood thinner medication as prescribed. ° °PRECAUTIONS:  If you experience chest pain or shortness of breath - call 911 immediately for transfer to the hospital emergency department.  ° °If you develop a fever greater that 101 F, purulent drainage from wound, increased redness or drainage from wound, foul odor from the wound/dressing, or calf pain - CONTACT YOUR SURGEON.   °                                                °FOLLOW-UP APPOINTMENTS:  If you do not already have a post-op appointment, please call the office for an appointment to be seen by your surgeon.  Guidelines for how soon to be seen are listed in your “After Visit Summary”, but are typically between 1-4 weeks after surgery. ° °OTHER INSTRUCTIONS:  ° °Knee Replacement:  Do not place pillow under knee, focus on keeping the knee straight while resting. CPM  instructions: 0-90 degrees, 2 hours in the morning, 2 hours in the afternoon, and 2 hours in the evening. Place foam block, curve side up under heel at all times except when in CPM or when walking.  DO NOT modify, tear, cut, or change the foam block in any way. ° °MAKE SURE YOU:  °• Understand these instructions.  °• Get help right away if you are not doing well or get worse.  ° ° °Thank you for letting us be a part of your medical care team.  It is a privilege we respect greatly.  We hope these instructions will help you stay on track for a fast and full recovery!  ° °Information on my medicine - XARELTO® (Rivaroxaban) ° °This medication education was reviewed with me or my healthcare representative as part of my discharge preparation.  The pharmacist that spoke with me during my hospital stay was:  Rondey Fallen P, RPH ° °Why was Xarelto® prescribed for you? °Xarelto® was prescribed for you to reduce the risk of blood clots forming after orthopedic surgery. The medical term for these abnormal blood clots is venous thromboembolism (VTE). ° °What do you need to know about xarelto® ? °Take your Xarelto® ONCE DAILY at the same time every day. °You may take it either with or without food. ° °If you have difficulty swallowing the tablet whole, you may crush it and mix in applesauce just prior to taking your dose. ° °Take Xarelto® exactly as prescribed by your doctor and DO NOT stop taking Xarelto® without talking to the doctor who prescribed the medication.  Stopping without other VTE prevention medication to take the place of Xarelto® may increase your risk of developing a clot. ° °After discharge, you should have regular check-up appointments with your healthcare provider that is prescribing your Xarelto®.   ° °What do you do if you miss a dose? °If you miss a dose, take it as soon as you remember on the same day then continue your regularly scheduled once daily regimen the next day. Do not take two doses of   Xarelto®  on the same day.  ° °Important Safety Information °A possible side effect of Xarelto® is bleeding. You should call your healthcare provider right away if you experience any of the following: °? Bleeding from an injury or your nose that does not stop. °? Unusual colored urine (red or dark brown) or unusual colored stools (red or black). °? Unusual bruising for unknown reasons. °? A serious fall or if you hit your head (even if there is no bleeding). ° °Some medicines may interact with Xarelto® and might increase your risk of bleeding while on Xarelto®. To help avoid this, consult your healthcare provider or pharmacist prior to using any new prescription or non-prescription medications, including herbals, vitamins, non-steroidal anti-inflammatory drugs (NSAIDs) and supplements. ° °This website has more information on Xarelto®: www.xarelto.com. ° ° °

## 2016-11-27 NOTE — Progress Notes (Signed)
Patient ID: Ezra Siteseressa F Digioia, female   DOB: 05/09/1961, 56 y.o.   MRN: [id number]     Subjective:  Patient reports pain as mild.  Patient denies any CP or SOB.    Objective:   VITALS:   Vitals:   11/26/16 1500 11/26/16 2036 11/27/16 0059 11/27/16 0641  BP: (!) 148/81 126/68 117/72 111/60  Pulse: 60 62 81 79  Resp: 16 18 18 16   Temp: 97.6 F (36.4 C) 98.8 F (37.1 C) 98.3 F (36.8 C) 99.3 F (37.4 C)  TempSrc: Oral Oral Oral Oral  SpO2: 100% 100% 97% 99%  Weight:      Height:        ABD soft Sensation intact distally Dorsiflexion/Plantar flexion intact Incision: dressing C/D/I and no drainage   Lab Results  Component Value Date   WBC 10.2 11/27/2016   HGB 10.4 (L) 11/27/2016   HCT 31.6 (L) 11/27/2016   MCV 97.5 11/27/2016   PLT 152 11/27/2016   BMET    Component Value Date/Time   NA 136 11/27/2016 0512   K 3.4 (L) 11/27/2016 0512   CL 102 11/27/2016 0512   CO2 26 11/27/2016 0512   GLUCOSE 107 (H) 11/27/2016 0512   BUN 11 11/27/2016 0512   CREATININE 0.98 11/27/2016 0512   CALCIUM 8.2 (L) 11/27/2016 0512   GFRNONAA >60 11/27/2016 0512   GFRAA >60 11/27/2016 0512     Assessment/Plan: 1 Day Post-Op   Principal Problem:   Primary localized osteoarthritis of right hip Active Problems:   S/P hip replacement, right   Advance diet Up with therapy Planning for DC home tomorrow WBAT Dry dressing PRN    Haskel KhanDOUGLAS PARRY, BRANDON 11/27/2016, 9:18 AM  Discussed and agree with above.   Teryl LucyJoshua Brit Carbonell, MD Cell [phone]

## 2016-11-28 LAB — CBC
HCT: 32.5 % — ABNORMAL LOW (ref 36.0–46.0)
HEMOGLOBIN: 10.5 g/dL — AB (ref 12.0–15.0)
MCH: 31.8 pg (ref 26.0–34.0)
MCHC: 32.3 g/dL (ref 30.0–36.0)
MCV: 98.5 fL (ref 78.0–100.0)
Platelets: 156 10*3/uL (ref 150–400)
RBC: 3.3 MIL/uL — AB (ref 3.87–5.11)
RDW: 13.3 % (ref 11.5–15.5)
WBC: 11.4 10*3/uL — ABNORMAL HIGH (ref 4.0–10.5)

## 2016-11-28 LAB — BASIC METABOLIC PANEL
Anion gap: 9 (ref 5–15)
BUN: 13 mg/dL (ref 6–20)
CHLORIDE: 102 mmol/L (ref 101–111)
CO2: 26 mmol/L (ref 22–32)
CREATININE: 0.97 mg/dL (ref 0.44–1.00)
Calcium: 8.4 mg/dL — ABNORMAL LOW (ref 8.9–10.3)
Glucose, Bld: 105 mg/dL — ABNORMAL HIGH (ref 65–99)
Potassium: 3.6 mmol/L (ref 3.5–5.1)
SODIUM: 137 mmol/L (ref 135–145)

## 2016-11-28 NOTE — Progress Notes (Signed)
Physical Therapy Treatment Patient Details Name: Hannah Daugherty MRN: [id number] DOB: 06-18-1961 Today's Date: 11/28/2016    History of Present Illness Pt is 56 y/o female s/p elective R THA with posterior hip precautions. PMH includes R ankle surgery and arthritis.     PT Comments    Pt performed increased mobility during session.  Continued gait training in regards to step through pattern.  Trained for curb negotiation in home, reviewed HEP during session.  Pt to d/c home with HHPT services.     Follow Up Recommendations  DC plan and follow up therapy as arranged by surgeon;Supervision/Assistance - 24 hour     Equipment Recommendations  Rolling walker with 5" wheels;3in1 (PT)    Recommendations for Other Services       Precautions / Restrictions Precautions Precautions: Posterior Hip Precaution Booklet Issued: No Precaution Comments: Pt able to recall 3/3 precautions and good job maintaining during functional mobility Restrictions Weight Bearing Restrictions: Yes RLE Weight Bearing: Weight bearing as tolerated    Mobility  Bed Mobility Overal bed mobility: Needs Assistance Bed Mobility: Supine to Sit     Supine to sit: Supervision     General bed mobility comments: No assist needed and good technique observed.    Transfers Overall transfer level: Needs assistance Equipment used: Rolling walker (2 wheeled) Transfers: Sit to/from Stand Sit to Stand: Supervision         General transfer comment: Good technique observed.  Supervision for safety.    Ambulation/Gait Ambulation/Gait assistance: Min guard Ambulation Distance (Feet): 200 Feet (x2 trials.  ) Assistive device: Rolling walker (2 wheeled) Gait Pattern/deviations: Step-through pattern;Antalgic;Trunk flexed;Decreased step length - left Gait velocity: Decreased   General Gait Details: Remains to required cues for gait symmetry, increasing stride on L to continue step through sequencing, and maintaining  upper trunk control.     Stairs Stairs: Yes   Stair Management: No rails;With walker;Forwards Number of Stairs: 2 General stair comments: Cues for sequencing and RW placement Pt required supervision with mod VCs.    Wheelchair Mobility    Modified Rankin (Stroke Patients Only)       Balance Overall balance assessment: Needs assistance Sitting-balance support: No upper extremity supported;Feet supported Sitting balance-Leahy Scale: Good     Standing balance support: Bilateral upper extremity supported;No upper extremity supported;During functional activity Standing balance-Leahy Scale: Fair Standing balance comment: Able to complete standing pericare without UE support.                             Cognition Arousal/Alertness: Awake/alert Behavior During Therapy: WFL for tasks assessed/performed Overall Cognitive Status: Within Functional Limits for tasks assessed                                        Exercises Total Joint Exercises Ankle Circles/Pumps: AROM;Both;10 reps;Supine Quad Sets: AROM;Right;10 reps;Supine Short Arc Quad: AROM;Right;10 reps;Supine Heel Slides: Right;5 reps;Supine;AAROM Hip ABduction/ADduction: AROM;Right;Standing;20 reps;Supine (1x10 in standing, 1x10 in supine) Long Arc Quad: AROM;Right;10 reps;Seated Knee Flexion: AROM;Right;10 reps;Standing Marching in Standing: AROM;Right;10 reps;Standing Standing Hip Extension: AROM;Right;10 reps;Standing    General Comments        Pertinent Vitals/Pain Pain Assessment: 0-10 Pain Score: 5  Pain Location: R hip  Pain Descriptors / Indicators: Aching;Sore;Operative site guarding Pain Intervention(s): Monitored during session;Repositioned (refused cold pac)    Home Living  Prior Function            PT Goals (current goals can now be found in the care plan section) Acute Rehab PT Goals Patient Stated Goal: to regain independence   Potential to Achieve Goals: Good Progress towards PT goals: Progressing toward goals    Frequency    7X/week      PT Plan Current plan remains appropriate    Co-evaluation              AM-PAC PT "6 Clicks" Daily Activity  Outcome Measure  Difficulty turning over in bed (including adjusting bedclothes, sheets and blankets)?: A Little Difficulty moving from lying on back to sitting on the side of the bed? : A Little Difficulty sitting down on and standing up from a chair with arms (e.g., wheelchair, bedside commode, etc,.)?: A Little Help needed moving to and from a bed to chair (including a wheelchair)?: A Little Help needed walking in hospital room?: A Little Help needed climbing 3-5 steps with a railing? : A Little 6 Click Score: 18    End of Session Equipment Utilized During Treatment: Gait belt Activity Tolerance: Patient tolerated treatment well Patient left: in chair;with call bell/phone within reach Nurse Communication: Mobility status PT Visit Diagnosis: Other abnormalities of gait and mobility (R26.89);Pain Pain - Right/Left: Right Pain - part of body: Hip     Time: [id number] PT Time Calculation (min) (ACUTE ONLY): 28 min  Charges:  $Gait Training: 8-22 mins $Therapeutic Exercise: 8-22 mins                    G Codes:       Joycelyn RuaAimee Neomia Herbel, PTA pager [phone]    Florestine Aversimee J Davione Lenker 11/28/2016, 9:29 AM

## 2016-11-28 NOTE — Discharge Summary (Addendum)
Physician Discharge Summary  Patient ID: Hannah Daugherty MRN: [id number] DOB/AGE: 54/02/1961 54 y.o.  Admit date: 11/26/2016 Discharge date: 11/28/2016  Admission Diagnoses:  Primary localized osteoarthritis of right hip  Discharge Diagnoses:  Principal Problem:   Primary localized osteoarthritis of right hip Active Problems:   S/P hip replacement, right   Past Medical History:  Diagnosis Date  . Arthritis   . Primary localized osteoarthritis of right hip 11/26/2016    Surgeries: Procedure(s): RIGHT TOTAL HIP ARTHROPLASTY on 11/26/2016   Consultants (if any):   Discharged Condition: Improved  Hospital Course: Hannah Daugherty is an 56 y.o. female who was admitted 11/26/2016 with a diagnosis of Primary localized osteoarthritis of right hip and went to the operating room on 11/26/2016 and underwent the above named procedures.    She was given perioperative antibiotics:  Anti-infectives    Start     Dose/Rate Route Frequency Ordered Stop   11/26/16 1230  ceFAZolin (ANCEF) IVPB 2g/100 mL premix     2 g 200 mL/hr over 30 Minutes Intravenous Every 6 hours 11/26/16 1147 11/26/16 1805   11/26/16 0700  ceFAZolin (ANCEF) IVPB 2g/100 mL premix     2 g 200 mL/hr over 30 Minutes Intravenous To [facility] 11/25/16 0939 11/26/16 0726    .  She was given sequential compression devices, early ambulation, and xarelto for DVT prophylaxis.  She benefited maximally from the hospital stay and there were no complications.    Recent vital signs:  Vitals:   11/27/16 2013 11/28/16 0451  BP: 104/64 132/80  Pulse: 68 78  Resp: 18 20  Temp: 98.2 F (36.8 C) 98.8 F (37.1 C)    Recent laboratory studies:  Lab Results  Component Value Date   HGB 10.5 (L) 11/28/2016   HGB 10.4 (L) 11/27/2016   HGB 13.4 11/14/2016   Lab Results  Component Value Date   WBC 11.4 (H) 11/28/2016   PLT 156 11/28/2016   No results found for: INR Lab Results  Component Value Date   NA 137 11/28/2016   K  3.6 11/28/2016   CL 102 11/28/2016   CO2 26 11/28/2016   BUN 13 11/28/2016   CREATININE 0.97 11/28/2016   GLUCOSE 105 (H) 11/28/2016    Discharge Medications:   Allergies as of 11/28/2016      Reactions   Codeine Nausea Only      Medication List    TAKE these medications   baclofen 10 MG tablet Commonly known as:  LIORESAL Take 1 tablet (10 mg total) by mouth 3 (three) times daily. As needed for muscle spasm   ondansetron 4 MG tablet Commonly known as:  ZOFRAN Take 1 tablet (4 mg total) by mouth every 8 (eight) hours as needed for nausea or vomiting.   OVER THE COUNTER MEDICATION Take 1 tablet by mouth daily. "Herbal Life"   OVER THE COUNTER MEDICATION Take 1 tablet by mouth 2 (two) times daily. Celluloss   oxyCODONE 5 MG immediate release tablet Commonly known as:  ROXICODONE Take 1-2 tablets (5-10 mg total) by mouth every 4 (four) hours as needed for severe pain.   rivaroxaban 10 MG Tabs tablet Commonly known as:  XARELTO Take 1 tablet (10 mg total) by mouth daily.   sennosides-docusate sodium 8.6-50 MG tablet Commonly known as:  SENOKOT-S Take 2 tablets by mouth daily.       Diagnostic Studies: Dg Hip Port Unilat With Pelvis 1v Right  Result Date: 11/26/2016 CLINICAL DATA:  Osteoarthritis of  the right hip. Status post right total hip prosthesis insertion. EXAM: DG HIP (WITH OR WITHOUT PELVIS) 1V PORT RIGHT COMPARISON:  None. FINDINGS: The components of the total hip prosthesis appear in excellent position. No fracture. IMPRESSION: Satisfactory appearance of the right hip in the AP projection after total hip replacement. Electronically Signed   By: Francene Boyers M.D.   On: 11/26/2016 10:56    Disposition:     Follow-up Information    Teryl Lucy, MD. Schedule an appointment as soon as possible for a visit in 2 weeks.   Specialty:  Orthopedic Surgery Contact information: [address] [place] [address] [phone]             Signed: Eulas Post 11/28/2016, 9:23 AM

## 2016-11-28 NOTE — Care Management Note (Signed)
Case Management Note  Patient Details  Name: Hannah Daugherty MRN: [id number] Date of Birth: 01/09/1961  Subjective/Objective:    56 yr old female admitted with osteoarthritis of the right hip, s/p right total hip arthroplasty.    Action/Plan: Case manager spoke with patient concerning discharge plan and DME needs. Patient was preoperatively setup with [facility], no changes. RW has been delivered. Patient declined 3in1, it isnt covered by her insurance. She states she will have family support at discharge.    Expected Discharge Date:  11/28/16               Expected Discharge Plan:  Home w Home Health Services  In-House Referral:  NA  Discharge planning Services  CM Consult  Post Acute Care Choice:  Home Health, Durable Medical Equipment Choice offered to:  Patient  DME Arranged:  [place] DME Agency:  [employer]  HH Arranged:  PT HH Agency[facility]  [facility]  Status of Service:  Completed, signed off  If discussed at [employer] Meetings, dates discussed:    Additional Comments:  Durenda GuthrieBrady, Arrielle Mcginn Naomi, RN 11/28/2016, 10:50 AM

## 2016-11-28 NOTE — Progress Notes (Signed)
Pt discharged per orders. Prescription for pain medicine given to patient. Follow up appointments, wound care, activity restrictions discussed. Xarelto discussed (how to take, signs of excessive bleeding, etc.). Pt with friend/family member at the bedside for d/c instructions. Time allowed for questions/concerns. Pt states she has none. Pt left the unit via wheelchair. - Asher MuirJamie Kasumi Ditullio,RN

## 2016-11-29 DIAGNOSIS — Z471 Aftercare following joint replacement surgery: Secondary | ICD-10-CM | POA: Diagnosis not present

## 2016-11-29 DIAGNOSIS — Z96641 Presence of right artificial hip joint: Secondary | ICD-10-CM | POA: Diagnosis not present

## 2016-11-29 DIAGNOSIS — Z7901 Long term (current) use of anticoagulants: Secondary | ICD-10-CM | POA: Diagnosis not present

## 2016-12-03 DIAGNOSIS — Z96641 Presence of right artificial hip joint: Secondary | ICD-10-CM | POA: Diagnosis not present

## 2016-12-03 DIAGNOSIS — Z471 Aftercare following joint replacement surgery: Secondary | ICD-10-CM | POA: Diagnosis not present

## 2016-12-03 DIAGNOSIS — Z7901 Long term (current) use of anticoagulants: Secondary | ICD-10-CM | POA: Diagnosis not present

## 2016-12-04 DIAGNOSIS — Z471 Aftercare following joint replacement surgery: Secondary | ICD-10-CM | POA: Diagnosis not present

## 2016-12-04 DIAGNOSIS — Z7901 Long term (current) use of anticoagulants: Secondary | ICD-10-CM | POA: Diagnosis not present

## 2016-12-04 DIAGNOSIS — Z96641 Presence of right artificial hip joint: Secondary | ICD-10-CM | POA: Diagnosis not present

## 2016-12-05 DIAGNOSIS — Z471 Aftercare following joint replacement surgery: Secondary | ICD-10-CM | POA: Diagnosis not present

## 2016-12-05 DIAGNOSIS — Z96641 Presence of right artificial hip joint: Secondary | ICD-10-CM | POA: Diagnosis not present

## 2016-12-05 DIAGNOSIS — Z7901 Long term (current) use of anticoagulants: Secondary | ICD-10-CM | POA: Diagnosis not present

## 2016-12-06 DIAGNOSIS — M1611 Unilateral primary osteoarthritis, right hip: Secondary | ICD-10-CM | POA: Diagnosis not present

## 2016-12-09 DIAGNOSIS — Z471 Aftercare following joint replacement surgery: Secondary | ICD-10-CM | POA: Diagnosis not present

## 2016-12-09 DIAGNOSIS — Z7901 Long term (current) use of anticoagulants: Secondary | ICD-10-CM | POA: Diagnosis not present

## 2016-12-09 DIAGNOSIS — Z96641 Presence of right artificial hip joint: Secondary | ICD-10-CM | POA: Diagnosis not present

## 2016-12-11 DIAGNOSIS — Z7901 Long term (current) use of anticoagulants: Secondary | ICD-10-CM | POA: Diagnosis not present

## 2016-12-11 DIAGNOSIS — Z471 Aftercare following joint replacement surgery: Secondary | ICD-10-CM | POA: Diagnosis not present

## 2016-12-11 DIAGNOSIS — Z96641 Presence of right artificial hip joint: Secondary | ICD-10-CM | POA: Diagnosis not present

## 2016-12-13 DIAGNOSIS — Z7901 Long term (current) use of anticoagulants: Secondary | ICD-10-CM | POA: Diagnosis not present

## 2016-12-13 DIAGNOSIS — Z471 Aftercare following joint replacement surgery: Secondary | ICD-10-CM | POA: Diagnosis not present

## 2016-12-13 DIAGNOSIS — Z96641 Presence of right artificial hip joint: Secondary | ICD-10-CM | POA: Diagnosis not present

## 2016-12-16 DIAGNOSIS — Z96641 Presence of right artificial hip joint: Secondary | ICD-10-CM | POA: Diagnosis not present

## 2016-12-16 DIAGNOSIS — Z471 Aftercare following joint replacement surgery: Secondary | ICD-10-CM | POA: Diagnosis not present

## 2016-12-16 DIAGNOSIS — Z7901 Long term (current) use of anticoagulants: Secondary | ICD-10-CM | POA: Diagnosis not present

## 2016-12-18 DIAGNOSIS — Z96641 Presence of right artificial hip joint: Secondary | ICD-10-CM | POA: Diagnosis not present

## 2016-12-18 DIAGNOSIS — Z7901 Long term (current) use of anticoagulants: Secondary | ICD-10-CM | POA: Diagnosis not present

## 2016-12-18 DIAGNOSIS — Z471 Aftercare following joint replacement surgery: Secondary | ICD-10-CM | POA: Diagnosis not present

## 2016-12-20 DIAGNOSIS — Z7901 Long term (current) use of anticoagulants: Secondary | ICD-10-CM | POA: Diagnosis not present

## 2016-12-20 DIAGNOSIS — Z471 Aftercare following joint replacement surgery: Secondary | ICD-10-CM | POA: Diagnosis not present

## 2016-12-20 DIAGNOSIS — Z96641 Presence of right artificial hip joint: Secondary | ICD-10-CM | POA: Diagnosis not present

## 2017-09-12 DIAGNOSIS — I10 Essential (primary) hypertension: Secondary | ICD-10-CM | POA: Diagnosis not present

## 2017-09-12 DIAGNOSIS — E559 Vitamin D deficiency, unspecified: Secondary | ICD-10-CM | POA: Diagnosis not present

## 2017-09-12 DIAGNOSIS — R7301 Impaired fasting glucose: Secondary | ICD-10-CM | POA: Diagnosis not present

## 2017-09-16 DIAGNOSIS — I10 Essential (primary) hypertension: Secondary | ICD-10-CM | POA: Diagnosis not present

## 2017-09-16 DIAGNOSIS — R7301 Impaired fasting glucose: Secondary | ICD-10-CM | POA: Diagnosis not present

## 2017-09-16 DIAGNOSIS — E559 Vitamin D deficiency, unspecified: Secondary | ICD-10-CM | POA: Diagnosis not present

## 2017-10-20 DIAGNOSIS — Z1211 Encounter for screening for malignant neoplasm of colon: Secondary | ICD-10-CM | POA: Diagnosis not present

## 2017-11-14 DIAGNOSIS — Z1211 Encounter for screening for malignant neoplasm of colon: Secondary | ICD-10-CM | POA: Diagnosis not present

## 2018-06-15 DIAGNOSIS — I10 Essential (primary) hypertension: Secondary | ICD-10-CM | POA: Diagnosis not present

## 2018-06-30 DIAGNOSIS — E669 Obesity, unspecified: Secondary | ICD-10-CM | POA: Diagnosis not present

## 2018-06-30 DIAGNOSIS — I10 Essential (primary) hypertension: Secondary | ICD-10-CM | POA: Diagnosis not present

## 2018-07-24 DIAGNOSIS — R7301 Impaired fasting glucose: Secondary | ICD-10-CM | POA: Diagnosis not present

## 2018-07-24 DIAGNOSIS — E559 Vitamin D deficiency, unspecified: Secondary | ICD-10-CM | POA: Diagnosis not present

## 2018-07-24 DIAGNOSIS — E669 Obesity, unspecified: Secondary | ICD-10-CM | POA: Diagnosis not present

## 2018-07-24 DIAGNOSIS — I10 Essential (primary) hypertension: Secondary | ICD-10-CM | POA: Diagnosis not present

## 2018-07-28 DIAGNOSIS — E559 Vitamin D deficiency, unspecified: Secondary | ICD-10-CM | POA: Diagnosis not present

## 2018-07-28 DIAGNOSIS — Z0000 Encounter for general adult medical examination without abnormal findings: Secondary | ICD-10-CM | POA: Diagnosis not present

## 2018-07-28 DIAGNOSIS — I10 Essential (primary) hypertension: Secondary | ICD-10-CM | POA: Diagnosis not present

## 2018-09-13 IMAGING — CR DG HIP (WITH OR WITHOUT PELVIS) 1V PORT*R*
2 series · 2 of 2 positions shown · non-contrast
Comparison: None.

CLINICAL DATA: Osteoarthritis of the right hip. Status post right
total hip prosthesis insertion.

EXAM:
DG HIP (WITH OR WITHOUT PELVIS) 1V PORT RIGHT

[[person_name] view]
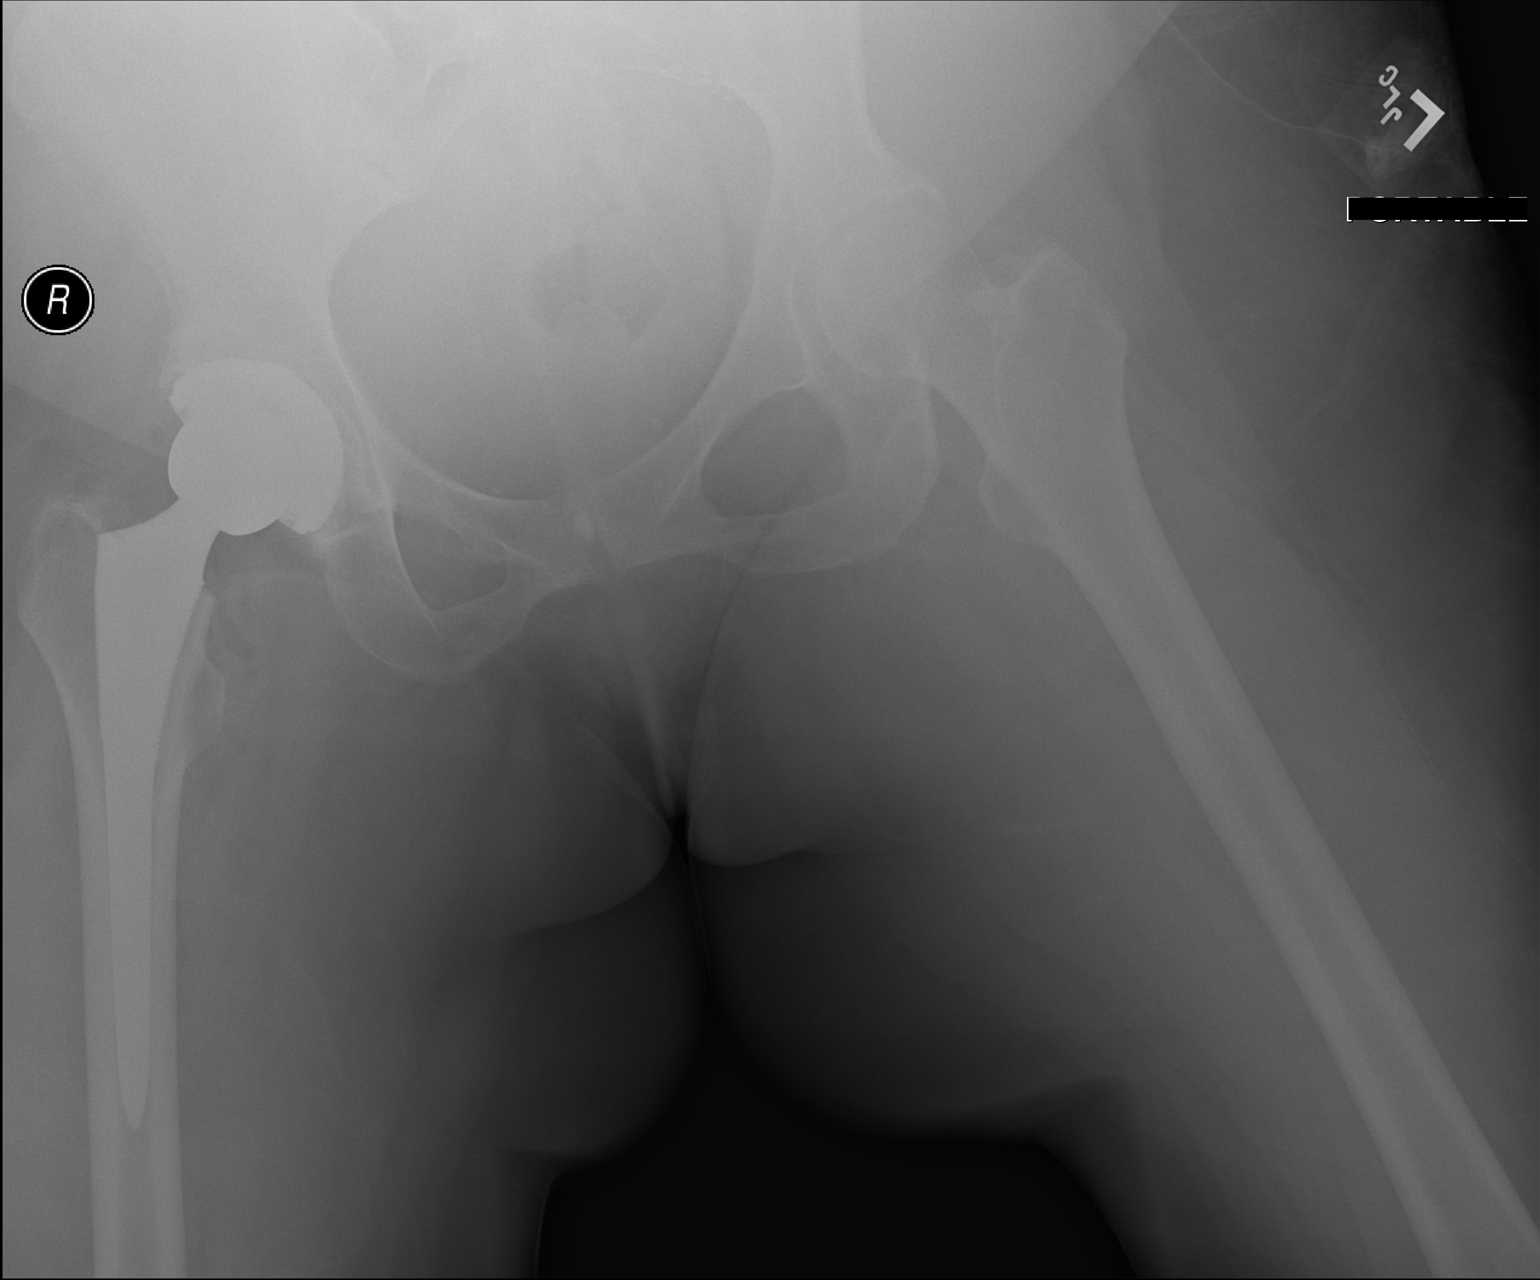

[xtable lateral]
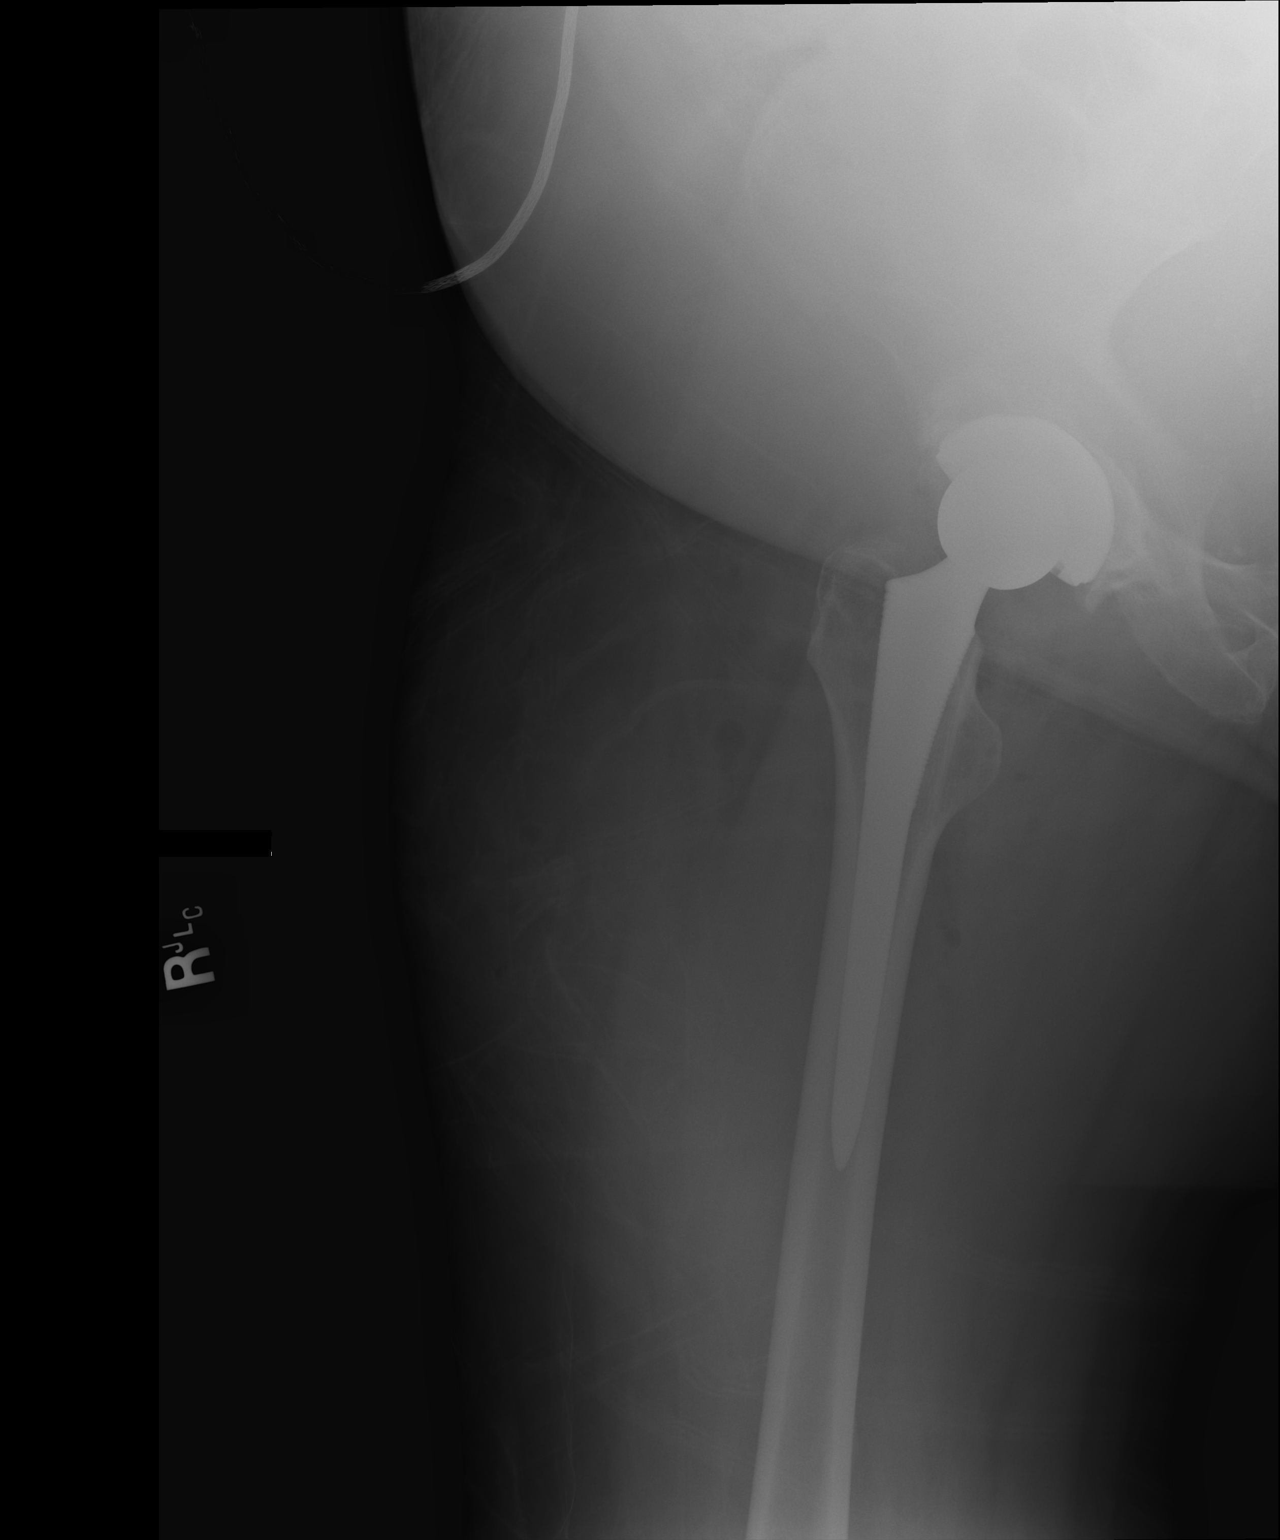

[2 of 2 positions shown; findings below may reference images not displayed]

FINDINGS: The components of the total hip prosthesis appear in excellent
position. No fracture.
IMPRESSION: Satisfactory appearance of the right hip in the AP projection after
total hip replacement.

## 2020-08-25 ENCOUNTER — Other Ambulatory Visit (HOSPITAL_COMMUNITY): Payer: Self-pay | Admitting: Internal Medicine

## 2020-08-25 DIAGNOSIS — Z13820 Encounter for screening for osteoporosis: Secondary | ICD-10-CM
